# Patient Record
Sex: Male | Born: 1950 | Marital: Single
Health system: Southern US, Community
[De-identification: ages and names within clinical notes are randomized; demographics above are authoritative.]

---

## 2005-07-07 ENCOUNTER — Other Ambulatory Visit: Payer: Self-pay

## 2005-07-07 ENCOUNTER — Emergency Department: Payer: Self-pay | Admitting: Emergency Medicine

## 2005-10-01 ENCOUNTER — Emergency Department: Payer: Self-pay | Admitting: Emergency Medicine

## 2005-10-03 ENCOUNTER — Emergency Department: Payer: Self-pay | Admitting: Emergency Medicine

## 2006-02-09 ENCOUNTER — Ambulatory Visit: Payer: Self-pay | Admitting: Nephrology

## 2011-01-10 ENCOUNTER — Inpatient Hospital Stay: Payer: Self-pay | Admitting: Vascular Surgery

## 2011-01-30 ENCOUNTER — Ambulatory Visit: Payer: Self-pay | Admitting: Vascular Surgery

## 2011-02-04 ENCOUNTER — Ambulatory Visit: Payer: Self-pay | Admitting: Vascular Surgery

## 2011-04-17 ENCOUNTER — Ambulatory Visit: Payer: Self-pay | Admitting: Vascular Surgery

## 2011-05-27 ENCOUNTER — Ambulatory Visit: Payer: Self-pay | Admitting: Vascular Surgery

## 2012-02-19 ENCOUNTER — Ambulatory Visit: Payer: Self-pay | Admitting: Internal Medicine

## 2012-02-20 ENCOUNTER — Emergency Department: Payer: Self-pay | Admitting: *Deleted

## 2012-02-20 LAB — COMPREHENSIVE METABOLIC PANEL
Albumin: 3 g/dL — ABNORMAL LOW (ref 3.4–5.0)
Alkaline Phosphatase: 340 U/L — ABNORMAL HIGH (ref 50–136)
Anion Gap: 18 — ABNORMAL HIGH (ref 7–16)
Bilirubin,Total: 1.8 mg/dL — ABNORMAL HIGH (ref 0.2–1.0)
Co2: 24 mmol/L (ref 21–32)
Creatinine: 12.58 mg/dL — ABNORMAL HIGH (ref 0.60–1.30)
Glucose: 84 mg/dL (ref 65–99)
Osmolality: 294 (ref 275–301)
Potassium: 6 mmol/L — ABNORMAL HIGH (ref 3.5–5.1)
Sodium: 138 mmol/L (ref 136–145)

## 2012-02-20 LAB — CBC
MCH: 30.6 pg (ref 26.0–34.0)
MCV: 92 fL (ref 80–100)
Platelet: 163 10*3/uL (ref 150–440)
RDW: 15.4 % — ABNORMAL HIGH (ref 11.5–14.5)
WBC: 13.9 10*3/uL — ABNORMAL HIGH (ref 3.8–10.6)

## 2012-02-20 LAB — LIPASE, BLOOD: Lipase: 197 U/L (ref 73–393)

## 2012-02-23 ENCOUNTER — Inpatient Hospital Stay: Payer: Self-pay | Admitting: Internal Medicine

## 2012-02-23 LAB — COMPREHENSIVE METABOLIC PANEL
Albumin: 2.8 g/dL — ABNORMAL LOW (ref 3.4–5.0)
Anion Gap: 22 — ABNORMAL HIGH (ref 7–16)
BUN: 123 mg/dL — ABNORMAL HIGH (ref 7–18)
Bilirubin,Total: 2 mg/dL — ABNORMAL HIGH (ref 0.2–1.0)
Chloride: 90 mmol/L — ABNORMAL LOW (ref 98–107)
Co2: 21 mmol/L (ref 21–32)
Creatinine: 19.98 mg/dL — ABNORMAL HIGH (ref 0.60–1.30)
EGFR (Non-African Amer.): 3 — ABNORMAL LOW
Glucose: 116 mg/dL — ABNORMAL HIGH (ref 65–99)
Osmolality: 307 (ref 275–301)
Potassium: 6.4 mmol/L — ABNORMAL HIGH (ref 3.5–5.1)
SGOT(AST): 28 U/L (ref 15–37)
SGPT (ALT): 40 U/L
Sodium: 133 mmol/L — ABNORMAL LOW (ref 136–145)
Total Protein: 8.7 g/dL — ABNORMAL HIGH (ref 6.4–8.2)

## 2012-02-23 LAB — CBC
HCT: 36.6 % — ABNORMAL LOW (ref 40.0–52.0)
HGB: 12.1 g/dL — ABNORMAL LOW (ref 13.0–18.0)
MCH: 30.2 pg (ref 26.0–34.0)
MCHC: 33 g/dL (ref 32.0–36.0)
MCV: 92 fL (ref 80–100)
RBC: 3.99 10*6/uL — ABNORMAL LOW (ref 4.40–5.90)

## 2012-02-23 LAB — URINALYSIS, COMPLETE
Glucose,UR: 50 mg/dL (ref 0–75)
Ketone: NEGATIVE
Nitrite: NEGATIVE
Ph: 8 (ref 4.5–8.0)
RBC,UR: 28 /HPF (ref 0–5)

## 2012-02-23 LAB — TROPONIN I
Troponin-I: 0.04 ng/mL
Troponin-I: 0.07 ng/mL — ABNORMAL HIGH

## 2012-02-23 LAB — LIPASE, BLOOD: Lipase: 119 U/L (ref 73–393)

## 2012-02-24 LAB — BASIC METABOLIC PANEL
Anion Gap: 24 — ABNORMAL HIGH (ref 7–16)
Calcium, Total: 6.8 mg/dL — CL (ref 8.5–10.1)
EGFR (African American): 3 — ABNORMAL LOW
EGFR (Non-African Amer.): 2 — ABNORMAL LOW
Osmolality: 307 (ref 275–301)

## 2012-02-24 LAB — RENAL FUNCTION PANEL
Albumin: 2.6 g/dL — ABNORMAL LOW (ref 3.4–5.0)
Anion Gap: 30 — ABNORMAL HIGH (ref 7–16)
Calcium, Total: 7.2 mg/dL — ABNORMAL LOW (ref 8.5–10.1)
Chloride: 91 mmol/L — ABNORMAL LOW (ref 98–107)
Co2: 14 mmol/L — ABNORMAL LOW (ref 21–32)
Creatinine: 21.85 mg/dL — ABNORMAL HIGH (ref 0.60–1.30)
Glucose: 136 mg/dL — ABNORMAL HIGH (ref 65–99)
Osmolality: 315 (ref 275–301)
Phosphorus: 5.8 mg/dL — ABNORMAL HIGH (ref 2.5–4.9)

## 2012-02-24 LAB — CBC WITH DIFFERENTIAL/PLATELET
Eosinophil: 1 %
HCT: 34.2 % — ABNORMAL LOW (ref 40.0–52.0)
Lymphocytes: 3 %
MCH: 30.5 pg (ref 26.0–34.0)
MCHC: 33.9 g/dL (ref 32.0–36.0)
MCV: 90 fL (ref 80–100)
Metamyelocyte: 1 %
Platelet: 163 10*3/uL (ref 150–440)
RDW: 16 % — ABNORMAL HIGH (ref 11.5–14.5)
Segmented Neutrophils: 83 %
WBC: 28.6 10*3/uL — ABNORMAL HIGH (ref 3.8–10.6)

## 2012-02-24 LAB — CK TOTAL AND CKMB (NOT AT ARMC): CK-MB: 2.9 ng/mL (ref 0.5–3.6)

## 2012-02-24 LAB — PSA: PSA: 6.3 ng/mL — ABNORMAL HIGH (ref 0.0–4.0)

## 2012-02-25 LAB — CBC WITH DIFFERENTIAL/PLATELET
Basophil #: 0 10*3/uL (ref 0.0–0.1)
Eosinophil %: 1.1 %
Lymphocyte #: 1.7 10*3/uL (ref 1.0–3.6)
Lymphocyte %: 5.8 %
MCH: 30.2 pg (ref 26.0–34.0)
MCV: 92 fL (ref 80–100)
Monocyte %: 5.2 %
Platelet: 210 10*3/uL (ref 150–440)
RBC: 3.37 10*6/uL — ABNORMAL LOW (ref 4.40–5.90)
RDW: 16.3 % — ABNORMAL HIGH (ref 11.5–14.5)

## 2012-02-25 LAB — BASIC METABOLIC PANEL
Anion Gap: 22 — ABNORMAL HIGH (ref 7–16)
BUN: 101 mg/dL — ABNORMAL HIGH (ref 7–18)
Calcium, Total: 7.1 mg/dL — ABNORMAL LOW (ref 8.5–10.1)
Co2: 23 mmol/L (ref 21–32)
Creatinine: 19.05 mg/dL — ABNORMAL HIGH (ref 0.60–1.30)
Potassium: 5 mmol/L (ref 3.5–5.1)
Sodium: 138 mmol/L (ref 136–145)

## 2012-02-25 LAB — PHOSPHORUS: Phosphorus: 7.1 mg/dL — ABNORMAL HIGH (ref 2.5–4.9)

## 2012-02-26 LAB — CBC WITH DIFFERENTIAL/PLATELET
Basophil %: 0 %
Eosinophil %: 0.6 %
Lymphocyte #: 1.6 10*3/uL (ref 1.0–3.6)
MCHC: 33.4 g/dL (ref 32.0–36.0)
MCV: 91 fL (ref 80–100)
Monocyte %: 5.9 %
Platelet: 218 10*3/uL (ref 150–440)
RBC: 3.41 10*6/uL — ABNORMAL LOW (ref 4.40–5.90)
WBC: 26.8 10*3/uL — ABNORMAL HIGH (ref 3.8–10.6)

## 2012-02-26 LAB — BASIC METABOLIC PANEL
Anion Gap: 18 — ABNORMAL HIGH (ref 7–16)
BUN: 65 mg/dL — ABNORMAL HIGH (ref 7–18)
Chloride: 89 mmol/L — ABNORMAL LOW (ref 98–107)
Co2: 27 mmol/L (ref 21–32)
Creatinine: 13.57 mg/dL — ABNORMAL HIGH (ref 0.60–1.30)
EGFR (Non-African Amer.): 4 — ABNORMAL LOW
Osmolality: 288 (ref 275–301)
Potassium: 5.9 mmol/L — ABNORMAL HIGH (ref 3.5–5.1)

## 2012-02-26 LAB — CULTURE, BLOOD (SINGLE)

## 2012-02-26 LAB — PHOSPHORUS: Phosphorus: 8 mg/dL — ABNORMAL HIGH (ref 2.5–4.9)

## 2012-02-27 LAB — CBC WITH DIFFERENTIAL/PLATELET
Bands: 7 %
Comment - H1-Com2: NORMAL
HCT: 30.2 % — ABNORMAL LOW (ref 40.0–52.0)
MCV: 92 fL (ref 80–100)
Monocytes: 5 %
RBC: 3.29 10*6/uL — ABNORMAL LOW (ref 4.40–5.90)
Segmented Neutrophils: 84 %
WBC: 26 10*3/uL — ABNORMAL HIGH (ref 3.8–10.6)

## 2012-02-27 LAB — BASIC METABOLIC PANEL
Anion Gap: 16 (ref 7–16)
Calcium, Total: 8.5 mg/dL (ref 8.5–10.1)
Chloride: 90 mmol/L — ABNORMAL LOW (ref 98–107)
Co2: 27 mmol/L (ref 21–32)
Creatinine: 11.04 mg/dL — ABNORMAL HIGH (ref 0.60–1.30)
Glucose: 111 mg/dL — ABNORMAL HIGH (ref 65–99)
Potassium: 4.9 mmol/L (ref 3.5–5.1)
Sodium: 133 mmol/L — ABNORMAL LOW (ref 136–145)

## 2012-02-28 LAB — BASIC METABOLIC PANEL
Calcium, Total: 8.5 mg/dL (ref 8.5–10.1)
Chloride: 88 mmol/L — ABNORMAL LOW (ref 98–107)
Co2: 25 mmol/L (ref 21–32)
EGFR (Non-African Amer.): 4 — ABNORMAL LOW
Glucose: 102 mg/dL — ABNORMAL HIGH (ref 65–99)
Osmolality: 282 (ref 275–301)
Sodium: 130 mmol/L — ABNORMAL LOW (ref 136–145)

## 2012-02-28 LAB — CBC WITH DIFFERENTIAL/PLATELET
Bands: 8 %
Comment - H1-Com2: NORMAL
Lymphocytes: 2 %
MCHC: 33.4 g/dL (ref 32.0–36.0)
MCV: 91 fL (ref 80–100)
Monocytes: 3 %
Platelet: 234 10*3/uL (ref 150–440)
RBC: 2.88 10*6/uL — ABNORMAL LOW (ref 4.40–5.90)
RDW: 16.2 % — ABNORMAL HIGH (ref 11.5–14.5)
Segmented Neutrophils: 86 %
WBC: 24.5 10*3/uL — ABNORMAL HIGH (ref 3.8–10.6)

## 2012-02-29 LAB — BASIC METABOLIC PANEL
Anion Gap: 19 — ABNORMAL HIGH (ref 7–16)
BUN: 94 mg/dL — ABNORMAL HIGH (ref 7–18)
Co2: 24 mmol/L (ref 21–32)
Creatinine: 13.91 mg/dL — ABNORMAL HIGH (ref 0.60–1.30)
Glucose: 105 mg/dL — ABNORMAL HIGH (ref 65–99)
Osmolality: 288 (ref 275–301)
Potassium: 6.7 mmol/L (ref 3.5–5.1)
Sodium: 129 mmol/L — ABNORMAL LOW (ref 136–145)

## 2012-02-29 LAB — CBC WITH DIFFERENTIAL/PLATELET
Bands: 5 %
Comment - H1-Com2: NORMAL
Lymphocytes: 5 %
MCHC: 33.4 g/dL (ref 32.0–36.0)
Platelet: 224 10*3/uL (ref 150–440)
RDW: 16 % — ABNORMAL HIGH (ref 11.5–14.5)
Segmented Neutrophils: 86 %
WBC: 22.9 10*3/uL — ABNORMAL HIGH (ref 3.8–10.6)

## 2012-02-29 LAB — VANCOMYCIN, TROUGH: Vancomycin, Trough: 7 ug/mL — ABNORMAL LOW (ref 10–20)

## 2012-02-29 LAB — POTASSIUM: Potassium: 3.1 mmol/L — ABNORMAL LOW (ref 3.5–5.1)

## 2012-03-01 LAB — BASIC METABOLIC PANEL
BUN: 81 mg/dL — ABNORMAL HIGH (ref 7–18)
Calcium, Total: 7.8 mg/dL — ABNORMAL LOW (ref 8.5–10.1)
Chloride: 90 mmol/L — ABNORMAL LOW (ref 98–107)
Co2: 27 mmol/L (ref 21–32)
Osmolality: 299 (ref 275–301)
Potassium: 4.3 mmol/L (ref 3.5–5.1)

## 2012-03-01 LAB — CBC WITH DIFFERENTIAL/PLATELET
Bands: 2 %
MCH: 30.2 pg (ref 26.0–34.0)
MCHC: 33.3 g/dL (ref 32.0–36.0)
MCV: 91 fL (ref 80–100)
Monocytes: 3 %
Platelet: 269 10*3/uL (ref 150–440)
RBC: 2.69 10*6/uL — ABNORMAL LOW (ref 4.40–5.90)
RDW: 16.3 % — ABNORMAL HIGH (ref 11.5–14.5)
WBC: 14.5 10*3/uL — ABNORMAL HIGH (ref 3.8–10.6)

## 2012-03-01 LAB — PHOSPHORUS: Phosphorus: 8.8 mg/dL — ABNORMAL HIGH (ref 2.5–4.9)

## 2012-03-02 LAB — CULTURE, BLOOD (SINGLE)

## 2012-03-02 LAB — BASIC METABOLIC PANEL
BUN: 93 mg/dL — ABNORMAL HIGH (ref 7–18)
Calcium, Total: 8.1 mg/dL — ABNORMAL LOW (ref 8.5–10.1)
Chloride: 91 mmol/L — ABNORMAL LOW (ref 98–107)
Co2: 25 mmol/L (ref 21–32)
Glucose: 147 mg/dL — ABNORMAL HIGH (ref 65–99)
Osmolality: 300 (ref 275–301)
Potassium: 4.4 mmol/L (ref 3.5–5.1)
Sodium: 134 mmol/L — ABNORMAL LOW (ref 136–145)

## 2012-03-02 LAB — CBC WITH DIFFERENTIAL/PLATELET
Basophil #: 0 10*3/uL (ref 0.0–0.1)
Eosinophil #: 0 10*3/uL (ref 0.0–0.7)
HCT: 26.8 % — ABNORMAL LOW (ref 40.0–52.0)
HGB: 8.9 g/dL — ABNORMAL LOW (ref 13.0–18.0)
Lymphocyte #: 0.8 10*3/uL — ABNORMAL LOW (ref 1.0–3.6)
Lymphocyte %: 5.8 %
MCHC: 33.3 g/dL (ref 32.0–36.0)
MCV: 91 fL (ref 80–100)
Monocyte %: 4.3 %
Neutrophil #: 11.9 10*3/uL — ABNORMAL HIGH (ref 1.4–6.5)
Platelet: 307 10*3/uL (ref 150–440)
RBC: 2.96 10*6/uL — ABNORMAL LOW (ref 4.40–5.90)
RDW: 14.7 % — ABNORMAL HIGH (ref 11.5–14.5)

## 2012-03-03 LAB — CBC WITH DIFFERENTIAL/PLATELET
Basophil #: 0 10*3/uL (ref 0.0–0.1)
Basophil %: 0 %
Eosinophil #: 0 10*3/uL (ref 0.0–0.7)
Eosinophil %: 0 %
HCT: 26.3 % — ABNORMAL LOW (ref 40.0–52.0)
Lymphocyte #: 0.5 10*3/uL — ABNORMAL LOW (ref 1.0–3.6)
Lymphocyte %: 4.1 %
MCHC: 33.2 g/dL (ref 32.0–36.0)
MCV: 92 fL (ref 80–100)
Monocyte %: 2.9 %
Neutrophil #: 11.6 10*3/uL — ABNORMAL HIGH (ref 1.4–6.5)
Platelet: 344 10*3/uL (ref 150–440)
RDW: 15.8 % — ABNORMAL HIGH (ref 11.5–14.5)
WBC: 12.5 10*3/uL — ABNORMAL HIGH (ref 3.8–10.6)

## 2012-03-03 LAB — BASIC METABOLIC PANEL
Anion Gap: 20 — ABNORMAL HIGH (ref 7–16)
BUN: 146 mg/dL — ABNORMAL HIGH (ref 7–18)
Calcium, Total: 7.4 mg/dL — ABNORMAL LOW (ref 8.5–10.1)
Chloride: 89 mmol/L — ABNORMAL LOW (ref 98–107)
Co2: 24 mmol/L (ref 21–32)
Creatinine: 11.14 mg/dL — ABNORMAL HIGH (ref 0.60–1.30)
EGFR (African American): 6 — ABNORMAL LOW
EGFR (Non-African Amer.): 5 — ABNORMAL LOW
Glucose: 158 mg/dL — ABNORMAL HIGH (ref 65–99)
Osmolality: 317 (ref 275–301)
Potassium: 4.7 mmol/L (ref 3.5–5.1)
Sodium: 133 mmol/L — ABNORMAL LOW (ref 136–145)

## 2012-03-04 LAB — CBC WITH DIFFERENTIAL/PLATELET
Basophil #: 0 10*3/uL (ref 0.0–0.1)
Eosinophil %: 0.2 %
HCT: 31 % — ABNORMAL LOW (ref 40.0–52.0)
HGB: 10.2 g/dL — ABNORMAL LOW (ref 13.0–18.0)
MCH: 29.9 pg (ref 26.0–34.0)
MCV: 90 fL (ref 80–100)
Monocyte #: 1 10*3/uL — ABNORMAL HIGH (ref 0.0–0.7)
Monocyte %: 7 %
Neutrophil %: 88.2 %
Platelet: 569 10*3/uL — ABNORMAL HIGH (ref 150–440)
RDW: 14.7 % — ABNORMAL HIGH (ref 11.5–14.5)
WBC: 15 10*3/uL — ABNORMAL HIGH (ref 3.8–10.6)

## 2012-03-04 LAB — BASIC METABOLIC PANEL
BUN: 71 mg/dL — ABNORMAL HIGH (ref 7–18)
Creatinine: 6.65 mg/dL — ABNORMAL HIGH (ref 0.60–1.30)
EGFR (African American): 11 — ABNORMAL LOW
EGFR (Non-African Amer.): 9 — ABNORMAL LOW
Glucose: 93 mg/dL (ref 65–99)
Potassium: 3.1 mmol/L — ABNORMAL LOW (ref 3.5–5.1)
Sodium: 137 mmol/L (ref 136–145)

## 2012-03-04 LAB — CULTURE, BLOOD (SINGLE)

## 2012-03-05 LAB — CBC WITH DIFFERENTIAL/PLATELET
Basophil %: 0 %
Eosinophil #: 0.1 10*3/uL (ref 0.0–0.7)
Eosinophil %: 1 %
HGB: 9.5 g/dL — ABNORMAL LOW (ref 13.0–18.0)
Lymphocyte %: 6.1 %
Neutrophil %: 85.2 %
RBC: 3.14 10*6/uL — ABNORMAL LOW (ref 4.40–5.90)

## 2012-03-06 LAB — CBC WITH DIFFERENTIAL/PLATELET
Basophil %: 0.1 %
Eosinophil #: 0.2 10*3/uL (ref 0.0–0.7)
Eosinophil %: 1.5 %
HGB: 8.8 g/dL — ABNORMAL LOW (ref 13.0–18.0)
Lymphocyte #: 0.6 10*3/uL — ABNORMAL LOW (ref 1.0–3.6)
Lymphocyte %: 5 %
MCH: 30.6 pg (ref 26.0–34.0)
MCHC: 32.8 g/dL (ref 32.0–36.0)
Monocyte #: 0.9 10*3/uL — ABNORMAL HIGH (ref 0.0–0.7)
Monocyte %: 7.1 %
Neutrophil #: 11 10*3/uL — ABNORMAL HIGH (ref 1.4–6.5)
Neutrophil %: 86.3 %
Platelet: 531 10*3/uL — ABNORMAL HIGH (ref 150–440)
RBC: 2.88 10*6/uL — ABNORMAL LOW (ref 4.40–5.90)
WBC: 12.7 10*3/uL — ABNORMAL HIGH (ref 3.8–10.6)

## 2012-03-07 LAB — CBC WITH DIFFERENTIAL/PLATELET
Eosinophil %: 1.4 %
HCT: 25.6 % — ABNORMAL LOW (ref 40.0–52.0)
HGB: 8.5 g/dL — ABNORMAL LOW (ref 13.0–18.0)
Lymphocyte #: 0.6 10*3/uL — ABNORMAL LOW (ref 1.0–3.6)
Lymphocyte %: 4.6 %
MCH: 31.1 pg (ref 26.0–34.0)
MCV: 94 fL (ref 80–100)
Monocyte %: 4.5 %
Neutrophil #: 12.6 10*3/uL — ABNORMAL HIGH (ref 1.4–6.5)
RBC: 2.73 10*6/uL — ABNORMAL LOW (ref 4.40–5.90)
RDW: 16.3 % — ABNORMAL HIGH (ref 11.5–14.5)
WBC: 14.1 10*3/uL — ABNORMAL HIGH (ref 3.8–10.6)

## 2012-03-07 LAB — CULTURE, BLOOD (SINGLE)

## 2012-03-08 LAB — CBC WITH DIFFERENTIAL/PLATELET
Eosinophil #: 0.3 10*3/uL (ref 0.0–0.7)
Eosinophil %: 2.4 %
HCT: 22.6 % — ABNORMAL LOW (ref 40.0–52.0)
Lymphocyte #: 0.8 10*3/uL — ABNORMAL LOW (ref 1.0–3.6)
Lymphocyte %: 6.3 %
MCH: 31.5 pg (ref 26.0–34.0)
MCHC: 33.6 g/dL (ref 32.0–36.0)
MCV: 94 fL (ref 80–100)
Monocyte #: 0.9 10*3/uL — ABNORMAL HIGH (ref 0.0–0.7)
Neutrophil %: 83.6 %
Platelet: 551 10*3/uL — ABNORMAL HIGH (ref 150–440)
RBC: 2.42 10*6/uL — ABNORMAL LOW (ref 4.40–5.90)

## 2012-03-08 LAB — VANCOMYCIN, TROUGH: Vancomycin, Trough: 21 ug/mL (ref 10–20)

## 2012-03-08 LAB — PHOSPHORUS: Phosphorus: 8.6 mg/dL — ABNORMAL HIGH (ref 2.5–4.9)

## 2012-03-09 LAB — CBC WITH DIFFERENTIAL/PLATELET
Basophil #: 0 10*3/uL (ref 0.0–0.1)
Eosinophil %: 1.5 %
MCH: 31.4 pg (ref 26.0–34.0)
Monocyte #: 1 10*3/uL — ABNORMAL HIGH (ref 0.0–0.7)
Monocyte %: 7.9 %
Neutrophil %: 84.5 %
Platelet: 551 10*3/uL — ABNORMAL HIGH (ref 150–440)
RBC: 2.51 10*6/uL — ABNORMAL LOW (ref 4.40–5.90)
RDW: 16 % — ABNORMAL HIGH (ref 11.5–14.5)
WBC: 13.1 10*3/uL — ABNORMAL HIGH (ref 3.8–10.6)

## 2012-03-09 LAB — BASIC METABOLIC PANEL
BUN: 56 mg/dL — ABNORMAL HIGH (ref 7–18)
Calcium, Total: 7.8 mg/dL — ABNORMAL LOW (ref 8.5–10.1)
Chloride: 96 mmol/L — ABNORMAL LOW (ref 98–107)
Co2: 28 mmol/L (ref 21–32)
EGFR (Non-African Amer.): 8 — ABNORMAL LOW
Osmolality: 291 (ref 275–301)
Potassium: 3.6 mmol/L (ref 3.5–5.1)
Sodium: 138 mmol/L (ref 136–145)

## 2012-03-09 LAB — CULTURE, BLOOD (SINGLE)

## 2012-03-09 LAB — MAGNESIUM: Magnesium: 1.9 mg/dL

## 2012-03-10 LAB — PHOSPHORUS: Phosphorus: 7.2 mg/dL — ABNORMAL HIGH (ref 2.5–4.9)

## 2012-03-11 LAB — CBC WITH DIFFERENTIAL/PLATELET
Basophil %: 0.8 %
Eosinophil #: 0.1 10*3/uL (ref 0.0–0.7)
Eosinophil %: 1 %
Lymphocyte %: 6.4 %
MCH: 31.8 pg (ref 26.0–34.0)
MCHC: 33.3 g/dL (ref 32.0–36.0)
Monocyte #: 1.4 10*3/uL — ABNORMAL HIGH (ref 0.0–0.7)
Neutrophil #: 9.7 10*3/uL — ABNORMAL HIGH (ref 1.4–6.5)
RBC: 2.28 10*6/uL — ABNORMAL LOW (ref 4.40–5.90)

## 2012-03-12 LAB — CBC WITH DIFFERENTIAL/PLATELET
Basophil #: 0 10*3/uL (ref 0.0–0.1)
Basophil %: 0 %
Eosinophil %: 0 %
HCT: 18.5 % — ABNORMAL LOW (ref 40.0–52.0)
HGB: 6.2 g/dL — ABNORMAL LOW (ref 13.0–18.0)
Lymphocyte #: 0.4 10*3/uL — ABNORMAL LOW (ref 1.0–3.6)
Lymphocyte %: 4.6 %
MCV: 96 fL (ref 80–100)
Monocyte %: 0.8 %
Neutrophil #: 9 10*3/uL — ABNORMAL HIGH (ref 1.4–6.5)
Neutrophil %: 94.6 %
RBC: 1.93 10*6/uL — ABNORMAL LOW (ref 4.40–5.90)
RDW: 17.5 % — ABNORMAL HIGH (ref 11.5–14.5)

## 2012-03-12 LAB — BASIC METABOLIC PANEL
Anion Gap: 16 (ref 7–16)
Calcium, Total: 8.1 mg/dL — ABNORMAL LOW (ref 8.5–10.1)
Chloride: 98 mmol/L (ref 98–107)
Creatinine: 9.66 mg/dL — ABNORMAL HIGH (ref 0.60–1.30)
EGFR (African American): 7 — ABNORMAL LOW
EGFR (Non-African Amer.): 6 — ABNORMAL LOW
Glucose: 146 mg/dL — ABNORMAL HIGH (ref 65–99)
Osmolality: 306 (ref 275–301)
Potassium: 5.7 mmol/L — ABNORMAL HIGH (ref 3.5–5.1)

## 2012-03-12 LAB — PHOSPHORUS: Phosphorus: 8.9 mg/dL — ABNORMAL HIGH (ref 2.5–4.9)

## 2012-03-13 LAB — HEMOGLOBIN: HGB: 9.7 g/dL — ABNORMAL LOW (ref 13.0–18.0)

## 2012-03-15 LAB — CBC WITH DIFFERENTIAL/PLATELET
Basophil #: 0 10*3/uL (ref 0.0–0.1)
Eosinophil %: 0 %
HCT: 29.5 % — ABNORMAL LOW (ref 40.0–52.0)
HGB: 9.7 g/dL — ABNORMAL LOW (ref 13.0–18.0)
Lymphocyte #: 0.4 10*3/uL — ABNORMAL LOW (ref 1.0–3.6)
Lymphocyte %: 5.1 %
MCHC: 33 g/dL (ref 32.0–36.0)
MCV: 92 fL (ref 80–100)
Monocyte #: 0.4 10*3/uL (ref 0.0–0.7)
Monocyte %: 5 %
Neutrophil #: 7.3 10*3/uL — ABNORMAL HIGH (ref 1.4–6.5)
Neutrophil %: 89.9 %
RBC: 3.21 10*6/uL — ABNORMAL LOW (ref 4.40–5.90)
RDW: 16.3 % — ABNORMAL HIGH (ref 11.5–14.5)
WBC: 8.1 10*3/uL (ref 3.8–10.6)

## 2012-03-15 LAB — RENAL FUNCTION PANEL
Anion Gap: 17 — ABNORMAL HIGH (ref 7–16)
Calcium, Total: 7.6 mg/dL — ABNORMAL LOW (ref 8.5–10.1)
Creatinine: 9.48 mg/dL — ABNORMAL HIGH (ref 0.60–1.30)
EGFR (African American): 7 — ABNORMAL LOW
EGFR (Non-African Amer.): 6 — ABNORMAL LOW
Glucose: 103 mg/dL — ABNORMAL HIGH (ref 65–99)
Osmolality: 319 (ref 275–301)
Phosphorus: 8.9 mg/dL — ABNORMAL HIGH (ref 2.5–4.9)
Sodium: 138 mmol/L (ref 136–145)

## 2012-03-15 LAB — PROT IMMUNOELECTROPHORES(ARMC)

## 2012-03-15 LAB — CULTURE, BLOOD (SINGLE)

## 2012-03-16 LAB — CBC WITH DIFFERENTIAL/PLATELET
Eosinophil #: 0 10*3/uL (ref 0.0–0.7)
HCT: 30.2 % — ABNORMAL LOW (ref 40.0–52.0)
Lymphocyte #: 0.5 10*3/uL — ABNORMAL LOW (ref 1.0–3.6)
MCH: 30.3 pg (ref 26.0–34.0)
MCV: 93 fL (ref 80–100)
Monocyte %: 4.2 %
Neutrophil %: 90.1 %
Platelet: 187 10*3/uL (ref 150–440)
RBC: 3.26 10*6/uL — ABNORMAL LOW (ref 4.40–5.90)
RDW: 16.6 % — ABNORMAL HIGH (ref 11.5–14.5)

## 2012-03-17 LAB — RENAL FUNCTION PANEL
Anion Gap: 15 (ref 7–16)
Calcium, Total: 7.1 mg/dL — ABNORMAL LOW (ref 8.5–10.1)
Chloride: 95 mmol/L — ABNORMAL LOW (ref 98–107)
Co2: 25 mmol/L (ref 21–32)
EGFR (African American): 9 — ABNORMAL LOW
Osmolality: 305 (ref 275–301)
Phosphorus: 8.6 mg/dL — ABNORMAL HIGH (ref 2.5–4.9)

## 2012-03-19 LAB — CBC WITH DIFFERENTIAL/PLATELET
Basophil #: 0 10*3/uL (ref 0.0–0.1)
Basophil #: 0 10*3/uL (ref 0.0–0.1)
Basophil %: 0 %
Basophil %: 0 %
Eosinophil #: 0 10*3/uL (ref 0.0–0.7)
Eosinophil #: 0 10*3/uL (ref 0.0–0.7)
Eosinophil %: 0 %
Eosinophil %: 0 %
HCT: 29.8 % — ABNORMAL LOW (ref 40.0–52.0)
HCT: 30.2 % — ABNORMAL LOW (ref 40.0–52.0)
HGB: 9.8 g/dL — ABNORMAL LOW (ref 13.0–18.0)
HGB: 10 g/dL — ABNORMAL LOW (ref 13.0–18.0)
Lymphocyte #: 0.3 10*3/uL — ABNORMAL LOW (ref 1.0–3.6)
Lymphocyte %: 2.7 %
Lymphocyte %: 1.7 %
MCH: 30.9 pg (ref 26.0–34.0)
MCH: 31.2 pg (ref 26.0–34.0)
MCHC: 33.3 g/dL (ref 32.0–36.0)
MCV: 94 fL (ref 80–100)
Monocyte #: 0.4 10*3/uL (ref 0.0–0.7)
Monocyte #: 0.2 10*3/uL (ref 0.0–0.7)
Monocyte %: 3.4 %
Monocyte %: 1.8 %
Neutrophil #: 11.6 10*3/uL — ABNORMAL HIGH (ref 1.4–6.5)
Neutrophil #: 12.3 10*3/uL — ABNORMAL HIGH (ref 1.4–6.5)
Neutrophil %: 93.9 %
Neutrophil %: 96.5 %
Platelet: 194 10*3/uL (ref 150–440)
RBC: 3.16 10*6/uL — ABNORMAL LOW (ref 4.40–5.90)
RDW: 16.9 % — ABNORMAL HIGH (ref 11.5–14.5)
RDW: 16.2 % — ABNORMAL HIGH (ref 11.5–14.5)
WBC: 12.3 10*3/uL — ABNORMAL HIGH (ref 3.8–10.6)

## 2012-03-19 LAB — RENAL FUNCTION PANEL
Anion Gap: 16 (ref 7–16)
Calcium, Total: 6.5 mg/dL — CL (ref 8.5–10.1)
Chloride: 97 mmol/L — ABNORMAL LOW (ref 98–107)
Co2: 24 mmol/L (ref 21–32)
EGFR (Non-African Amer.): 8 — ABNORMAL LOW
Glucose: 174 mg/dL — ABNORMAL HIGH (ref 65–99)
Osmolality: 309 (ref 275–301)
Potassium: 5.1 mmol/L (ref 3.5–5.1)

## 2012-03-19 LAB — CULTURE, BLOOD (SINGLE)

## 2012-03-21 ENCOUNTER — Ambulatory Visit: Payer: Self-pay | Admitting: Internal Medicine

## 2012-03-21 LAB — PSA: PSA: 1.2 ng/mL (ref 0.0–4.0)

## 2012-03-22 LAB — CBC WITH DIFFERENTIAL/PLATELET
Basophil %: 0 %
Eosinophil %: 0.1 %
Eosinophil: 1 %
Lymphocyte #: 0.6 10*3/uL — ABNORMAL LOW (ref 1.0–3.6)
Lymphocyte %: 3.5 %
MCH: 31.6 pg (ref 26.0–34.0)
MCV: 95 fL (ref 80–100)
Monocyte #: 0.1 10*3/uL (ref 0.0–0.7)
Monocytes: 2 %
Neutrophil #: 15.8 10*3/uL — ABNORMAL HIGH (ref 1.4–6.5)
Platelet: 253 10*3/uL (ref 150–440)
RBC: 3.5 10*6/uL — ABNORMAL LOW (ref 4.40–5.90)
RDW: 18.2 % — ABNORMAL HIGH (ref 11.5–14.5)
WBC: 16.5 10*3/uL — ABNORMAL HIGH (ref 3.8–10.6)

## 2012-03-22 LAB — RENAL FUNCTION PANEL
BUN: 110 mg/dL — ABNORMAL HIGH (ref 7–18)
Chloride: 97 mmol/L — ABNORMAL LOW (ref 98–107)
Co2: 24 mmol/L (ref 21–32)
Creatinine: 7.78 mg/dL — ABNORMAL HIGH (ref 0.60–1.30)
Osmolality: 308 (ref 275–301)
Phosphorus: 6.4 mg/dL — ABNORMAL HIGH (ref 2.5–4.9)
Potassium: 5.9 mmol/L — ABNORMAL HIGH (ref 3.5–5.1)
Sodium: 137 mmol/L (ref 136–145)

## 2012-04-20 ENCOUNTER — Ambulatory Visit: Payer: Self-pay | Admitting: Internal Medicine

## 2012-04-23 ENCOUNTER — Inpatient Hospital Stay: Payer: Self-pay | Admitting: Internal Medicine

## 2012-04-23 LAB — CBC
HCT: 19.5 % — ABNORMAL LOW (ref 40.0–52.0)
HGB: 6.2 g/dL — ABNORMAL LOW (ref 13.0–18.0)
MCV: 91 fL (ref 80–100)
Platelet: 460 10*3/uL — ABNORMAL HIGH (ref 150–440)
RBC: 2.14 10*6/uL — ABNORMAL LOW (ref 4.40–5.90)
WBC: 13.1 10*3/uL — ABNORMAL HIGH (ref 3.8–10.6)

## 2012-04-23 LAB — COMPREHENSIVE METABOLIC PANEL
Alkaline Phosphatase: 159 U/L — ABNORMAL HIGH (ref 50–136)
Chloride: 99 mmol/L (ref 98–107)
Co2: 32 mmol/L (ref 21–32)
SGPT (ALT): 13 U/L
Sodium: 137 mmol/L (ref 136–145)
Total Protein: 7.2 g/dL (ref 6.4–8.2)

## 2012-04-24 DIAGNOSIS — I059 Rheumatic mitral valve disease, unspecified: Secondary | ICD-10-CM

## 2012-04-24 LAB — CBC WITH DIFFERENTIAL/PLATELET
Basophil #: 0.1 10*3/uL (ref 0.0–0.1)
Basophil %: 1.2 %
Basophil %: 2.5 %
Eosinophil #: 0.5 10*3/uL (ref 0.0–0.7)
Eosinophil #: 0.8 10*3/uL — ABNORMAL HIGH (ref 0.0–0.7)
Eosinophil %: 6.9 %
HCT: 17.6 % — ABNORMAL LOW (ref 40.0–52.0)
HCT: 23.7 % — ABNORMAL LOW (ref 40.0–52.0)
HGB: 7.6 g/dL — ABNORMAL LOW (ref 13.0–18.0)
Lymphocyte #: 1.2 10*3/uL (ref 1.0–3.6)
Lymphocyte #: 1.7 10*3/uL (ref 1.0–3.6)
MCH: 29 pg (ref 26.0–34.0)
MCH: 28 pg (ref 26.0–34.0)
MCHC: 31.9 g/dL — ABNORMAL LOW (ref 32.0–36.0)
MCHC: 32.2 g/dL (ref 32.0–36.0)
MCV: 91 fL (ref 80–100)
MCV: 87 fL (ref 80–100)
Monocyte #: 0.7 x10 3/mm (ref 0.2–1.0)
Monocyte %: 6.6 %
Monocyte %: 8.9 %
Neutrophil #: 8.3 10*3/uL — ABNORMAL HIGH (ref 1.4–6.5)
Neutrophil %: 76.6 %
Neutrophil %: 67.9 %
Platelet: 412 10*3/uL (ref 150–440)
RBC: 2.72 10*6/uL — ABNORMAL LOW (ref 4.40–5.90)
RDW: 17.1 % — ABNORMAL HIGH (ref 11.5–14.5)
RDW: 19.1 % — ABNORMAL HIGH (ref 11.5–14.5)
WBC: 11.2 10*3/uL — ABNORMAL HIGH (ref 3.8–10.6)
WBC: 12.3 10*3/uL — ABNORMAL HIGH (ref 3.8–10.6)

## 2012-04-24 LAB — FERRITIN: Ferritin (ARMC): 1420 ng/mL — ABNORMAL HIGH (ref 8–388)

## 2012-04-24 LAB — COMPREHENSIVE METABOLIC PANEL
Albumin: 1.6 g/dL — ABNORMAL LOW (ref 3.4–5.0)
Alkaline Phosphatase: 136 U/L (ref 50–136)
Anion Gap: 9 (ref 7–16)
Bilirubin,Total: 0.7 mg/dL (ref 0.2–1.0)
Calcium, Total: 7.6 mg/dL — ABNORMAL LOW (ref 8.5–10.1)
Creatinine: 2.95 mg/dL — ABNORMAL HIGH (ref 0.60–1.30)
Glucose: 80 mg/dL (ref 65–99)
Osmolality: 275 (ref 275–301)
SGPT (ALT): 10 U/L — ABNORMAL LOW
Total Protein: 6.1 g/dL — ABNORMAL LOW (ref 6.4–8.2)

## 2012-04-24 LAB — LACTATE DEHYDROGENASE: LDH: 301 U/L — ABNORMAL HIGH (ref 87–241)

## 2012-04-25 LAB — BASIC METABOLIC PANEL
Anion Gap: 11 (ref 7–16)
BUN: 17 mg/dL (ref 7–18)
Calcium, Total: 7.8 mg/dL — ABNORMAL LOW (ref 8.5–10.1)
Chloride: 100 mmol/L (ref 98–107)
Co2: 28 mmol/L (ref 21–32)
Creatinine: 4.66 mg/dL — ABNORMAL HIGH (ref 0.60–1.30)
EGFR (African American): 14 — ABNORMAL LOW
EGFR (Non-African Amer.): 12 — ABNORMAL LOW
Osmolality: 278 (ref 275–301)
Potassium: 3.5 mmol/L (ref 3.5–5.1)
Sodium: 139 mmol/L (ref 136–145)

## 2012-04-25 LAB — CBC WITH DIFFERENTIAL/PLATELET
Basophil #: 0.2 10*3/uL — ABNORMAL HIGH (ref 0.0–0.1)
Basophil %: 1.7 %
Eosinophil #: 1.1 10*3/uL — ABNORMAL HIGH (ref 0.0–0.7)
Eosinophil %: 8.3 %
HCT: 27.3 % — ABNORMAL LOW (ref 40.0–52.0)
HGB: 8.7 g/dL — ABNORMAL LOW (ref 13.0–18.0)
Lymphocyte #: 2.1 10*3/uL (ref 1.0–3.6)
Lymphocyte %: 15.5 %
MCH: 27.5 pg (ref 26.0–34.0)
MCHC: 32 g/dL (ref 32.0–36.0)
Monocyte #: 1.1 x10 3/mm — ABNORMAL HIGH (ref 0.2–1.0)
Monocyte %: 8.2 %
Neutrophil #: 9 10*3/uL — ABNORMAL HIGH (ref 1.4–6.5)
WBC: 13.6 10*3/uL — ABNORMAL HIGH (ref 3.8–10.6)

## 2012-04-26 LAB — PHOSPHORUS: Phosphorus: 4.3 mg/dL (ref 2.5–4.9)

## 2012-04-26 LAB — BASIC METABOLIC PANEL
Anion Gap: 9 (ref 7–16)
BUN: 26 mg/dL — ABNORMAL HIGH (ref 7–18)
Calcium, Total: 8.1 mg/dL — ABNORMAL LOW (ref 8.5–10.1)
Creatinine: 6.09 mg/dL — ABNORMAL HIGH (ref 0.60–1.30)
EGFR (African American): 10 — ABNORMAL LOW
Glucose: 87 mg/dL (ref 65–99)
Sodium: 139 mmol/L (ref 136–145)

## 2012-04-26 LAB — CBC WITH DIFFERENTIAL/PLATELET
Eosinophil #: 1.3 10*3/uL — ABNORMAL HIGH (ref 0.0–0.7)
Eosinophil %: 8 %
HCT: 29.7 % — ABNORMAL LOW (ref 40.0–52.0)
HGB: 9.8 g/dL — ABNORMAL LOW (ref 13.0–18.0)
Lymphocyte %: 17.5 %
MCH: 28.6 pg (ref 26.0–34.0)
MCHC: 32.9 g/dL (ref 32.0–36.0)
Monocyte #: 1.2 x10 3/mm — ABNORMAL HIGH (ref 0.2–1.0)
Monocyte %: 6.9 %
Neutrophil #: 11.1 10*3/uL — ABNORMAL HIGH (ref 1.4–6.5)
Neutrophil %: 66.1 %
RBC: 3.41 10*6/uL — ABNORMAL LOW (ref 4.40–5.90)
RDW: 17.9 % — ABNORMAL HIGH (ref 11.5–14.5)
WBC: 16.8 10*3/uL — ABNORMAL HIGH (ref 3.8–10.6)

## 2012-04-27 LAB — CBC WITH DIFFERENTIAL/PLATELET
Basophil #: 0.2 10*3/uL — ABNORMAL HIGH (ref 0.0–0.1)
Basophil %: 1.2 %
Eosinophil #: 1.4 10*3/uL — ABNORMAL HIGH (ref 0.0–0.7)
Eosinophil %: 10.1 %
HCT: 27.4 % — ABNORMAL LOW (ref 40.0–52.0)
HGB: 8.8 g/dL — ABNORMAL LOW (ref 13.0–18.0)
Lymphocyte #: 1.8 10*3/uL (ref 1.0–3.6)
Lymphocyte %: 13.1 %
MCH: 27.6 pg (ref 26.0–34.0)
MCHC: 32 g/dL (ref 32.0–36.0)
MCV: 86 fL (ref 80–100)
Monocyte #: 1.3 x10 3/mm — ABNORMAL HIGH (ref 0.2–1.0)
Monocyte %: 9.3 %
Neutrophil #: 9.2 10*3/uL — ABNORMAL HIGH (ref 1.4–6.5)
Neutrophil %: 66.3 %
Platelet: 278 10*3/uL (ref 150–440)
RBC: 3.18 10*6/uL — ABNORMAL LOW (ref 4.40–5.90)
RDW: 17.7 % — ABNORMAL HIGH (ref 11.5–14.5)
WBC: 13.9 10*3/uL — ABNORMAL HIGH (ref 3.8–10.6)

## 2012-04-27 LAB — PATHOLOGY REPORT

## 2012-04-28 LAB — CBC WITH DIFFERENTIAL/PLATELET
Basophil #: 0.2 10*3/uL — ABNORMAL HIGH (ref 0.0–0.1)
Eosinophil #: 1.9 10*3/uL — ABNORMAL HIGH (ref 0.0–0.7)
Eosinophil %: 12.6 %
HCT: 26.9 % — ABNORMAL LOW (ref 40.0–52.0)
HGB: 8.5 g/dL — ABNORMAL LOW (ref 13.0–18.0)
Lymphocyte #: 1.9 10*3/uL (ref 1.0–3.6)
Lymphocyte %: 12.7 %
Monocyte #: 1.1 x10 3/mm — ABNORMAL HIGH (ref 0.2–1.0)
Neutrophil #: 10.2 10*3/uL — ABNORMAL HIGH (ref 1.4–6.5)
RBC: 3.13 10*6/uL — ABNORMAL LOW (ref 4.40–5.90)
RDW: 17.7 % — ABNORMAL HIGH (ref 11.5–14.5)

## 2012-04-28 LAB — PHOSPHORUS: Phosphorus: 3.6 mg/dL (ref 2.5–4.9)

## 2012-04-29 LAB — PATHOLOGY REPORT

## 2012-05-20 ENCOUNTER — Inpatient Hospital Stay: Payer: Self-pay | Admitting: Internal Medicine

## 2012-05-20 LAB — CBC
HCT: 25 % — ABNORMAL LOW (ref 40.0–52.0)
MCH: 24.8 pg — ABNORMAL LOW (ref 26.0–34.0)
MCHC: 30.1 g/dL — ABNORMAL LOW (ref 32.0–36.0)
WBC: 22.6 10*3/uL — ABNORMAL HIGH (ref 3.8–10.6)

## 2012-05-20 LAB — BASIC METABOLIC PANEL
BUN: 20 mg/dL — ABNORMAL HIGH (ref 7–18)
Co2: 29 mmol/L (ref 21–32)
Creatinine: 4.08 mg/dL — ABNORMAL HIGH (ref 0.60–1.30)
EGFR (African American): 17 — ABNORMAL LOW
EGFR (Non-African Amer.): 15 — ABNORMAL LOW
Glucose: 94 mg/dL (ref 65–99)
Osmolality: 269 (ref 275–301)

## 2012-05-21 ENCOUNTER — Ambulatory Visit: Payer: Self-pay | Admitting: Internal Medicine

## 2012-05-21 LAB — BASIC METABOLIC PANEL
Anion Gap: 11 (ref 7–16)
Co2: 27 mmol/L (ref 21–32)
Creatinine: 4.4 mg/dL — ABNORMAL HIGH (ref 0.60–1.30)
EGFR (Non-African Amer.): 13 — ABNORMAL LOW
Glucose: 89 mg/dL (ref 65–99)
Osmolality: 278 (ref 275–301)
Sodium: 138 mmol/L (ref 136–145)

## 2012-05-21 LAB — CBC WITH DIFFERENTIAL/PLATELET
Basophil %: 1.3 %
Eosinophil #: 0.3 10*3/uL (ref 0.0–0.7)
Eosinophil %: 2 %
HCT: 26 % — ABNORMAL LOW (ref 40.0–52.0)
HGB: 8.2 g/dL — ABNORMAL LOW (ref 13.0–18.0)
Lymphocyte #: 2.1 10*3/uL (ref 1.0–3.6)
MCH: 26 pg (ref 26.0–34.0)
MCV: 82 fL (ref 80–100)
Monocyte #: 1 x10 3/mm (ref 0.2–1.0)
Monocyte %: 5.4 %
Neutrophil #: 14 10*3/uL — ABNORMAL HIGH (ref 1.4–6.5)
RDW: 17.6 % — ABNORMAL HIGH (ref 11.5–14.5)

## 2012-05-22 LAB — CBC WITH DIFFERENTIAL/PLATELET
Basophil #: 0.1 10*3/uL (ref 0.0–0.1)
Basophil %: 0.5 %
Eosinophil #: 0.4 10*3/uL (ref 0.0–0.7)
Eosinophil %: 2.3 %
HCT: 26 % — ABNORMAL LOW (ref 40.0–52.0)
HGB: 8.2 g/dL — ABNORMAL LOW (ref 13.0–18.0)
Lymphocyte #: 1.7 10*3/uL (ref 1.0–3.6)
Lymphocyte %: 9.5 %
MCH: 25.7 pg — ABNORMAL LOW (ref 26.0–34.0)
MCHC: 31.4 g/dL — ABNORMAL LOW (ref 32.0–36.0)
MCV: 82 fL (ref 80–100)
Monocyte #: 0.9 x10 3/mm (ref 0.2–1.0)
Monocyte %: 5.3 %
Neutrophil #: 14.5 10*3/uL — ABNORMAL HIGH (ref 1.4–6.5)
Neutrophil %: 82.4 %
Platelet: 329 10*3/uL (ref 150–440)
RBC: 3.18 10*6/uL — ABNORMAL LOW (ref 4.40–5.90)
RDW: 18.3 % — ABNORMAL HIGH (ref 11.5–14.5)
WBC: 17.6 10*3/uL — ABNORMAL HIGH (ref 3.8–10.6)

## 2012-05-24 LAB — CBC WITH DIFFERENTIAL/PLATELET
Basophil #: 0.2 10*3/uL — ABNORMAL HIGH (ref 0.0–0.1)
Eosinophil #: 0.5 10*3/uL (ref 0.0–0.7)
Eosinophil %: 3.6 %
HCT: 27.7 % — ABNORMAL LOW (ref 40.0–52.0)
Lymphocyte #: 1.6 10*3/uL (ref 1.0–3.6)
Lymphocyte %: 11.5 %
MCH: 27.2 pg (ref 26.0–34.0)
MCV: 84 fL (ref 80–100)
Monocyte %: 6.3 %
Neutrophil #: 10.7 10*3/uL — ABNORMAL HIGH (ref 1.4–6.5)
Neutrophil %: 77 %
Platelet: 341 10*3/uL (ref 150–440)
RBC: 3.28 10*6/uL — ABNORMAL LOW (ref 4.40–5.90)

## 2012-05-24 LAB — CLOSTRIDIUM DIFFICILE BY PCR

## 2012-05-26 LAB — RENAL FUNCTION PANEL
Anion Gap: 7 (ref 7–16)
Calcium, Total: 7.8 mg/dL — ABNORMAL LOW (ref 8.5–10.1)
Co2: 30 mmol/L (ref 21–32)
Creatinine: 4.43 mg/dL — ABNORMAL HIGH (ref 0.60–1.30)
EGFR (African American): 15 — ABNORMAL LOW
EGFR (Non-African Amer.): 13 — ABNORMAL LOW
Glucose: 86 mg/dL (ref 65–99)
Phosphorus: 2.9 mg/dL (ref 2.5–4.9)
Potassium: 3.9 mmol/L (ref 3.5–5.1)
Sodium: 134 mmol/L — ABNORMAL LOW (ref 136–145)

## 2012-05-26 LAB — CBC WITH DIFFERENTIAL/PLATELET
Basophil #: 0.2 10*3/uL — ABNORMAL HIGH (ref 0.0–0.1)
Basophil %: 1.2 %
Eosinophil #: 0.5 10*3/uL (ref 0.0–0.7)
Eosinophil %: 3.6 %
Lymphocyte #: 1.5 10*3/uL (ref 1.0–3.6)
MCHC: 31 g/dL — ABNORMAL LOW (ref 32.0–36.0)
Monocyte #: 1 x10 3/mm (ref 0.2–1.0)
Platelet: 329 10*3/uL (ref 150–440)
RDW: 19 % — ABNORMAL HIGH (ref 11.5–14.5)
WBC: 13 10*3/uL — ABNORMAL HIGH (ref 3.8–10.6)

## 2012-05-26 LAB — CULTURE, BLOOD (SINGLE)

## 2012-05-26 LAB — VANCOMYCIN, TROUGH: Vancomycin, Trough: 16 ug/mL (ref 10–20)

## 2012-05-27 LAB — CBC WITH DIFFERENTIAL/PLATELET
Basophil #: 0.2 x10 3/mm 3 — ABNORMAL HIGH
Basophil %: 1.4 %
Eosinophil #: 0.6 x10 3/mm 3
Eosinophil %: 4.9 %
HCT: 31.7 % — ABNORMAL LOW
HGB: 10.1 g/dL — ABNORMAL LOW
Lymphocyte %: 12 %
Lymphs Abs: 1.4 x10 3/mm 3
MCH: 28.5 pg
MCHC: 31.7 g/dL — ABNORMAL LOW
MCV: 90 fL
Monocyte #: 0.8 "x10 3/mm "
Monocyte %: 7 %
Neutrophil #: 8.7 x10 3/mm 3 — ABNORMAL HIGH
Neutrophil %: 74.7 %
Platelet: 319 x10 3/mm 3
RBC: 3.53 x10 6/mm 3 — ABNORMAL LOW
RDW: 18.8 % — ABNORMAL HIGH
WBC: 11.7 x10 3/mm 3 — ABNORMAL HIGH

## 2012-06-23 ENCOUNTER — Inpatient Hospital Stay: Payer: Self-pay | Admitting: Internal Medicine

## 2012-06-23 LAB — CBC WITH DIFFERENTIAL/PLATELET
Basophil #: 0.1 10*3/uL (ref 0.0–0.1)
Basophil %: 0.7 %
Eosinophil #: 0.8 10*3/uL — ABNORMAL HIGH (ref 0.0–0.7)
Eosinophil #: 0.2 10*3/uL (ref 0.0–0.7)
Eosinophil %: 5.4 %
Lymphocyte %: 39.5 %
Lymphocyte %: 7.7 %
MCH: 28.9 pg (ref 26.0–34.0)
MCHC: 30.7 g/dL — ABNORMAL LOW (ref 32.0–36.0)
MCHC: 30.8 g/dL — ABNORMAL LOW (ref 32.0–36.0)
Monocyte #: 1.3 x10 3/mm — ABNORMAL HIGH (ref 0.2–1.0)
Monocyte %: 6.5 %
Monocyte %: 9.8 %
Neutrophil #: 10.8 10*3/uL — ABNORMAL HIGH (ref 1.4–6.5)
Neutrophil %: 47.6 %
Neutrophil %: 80.1 %
Platelet: 357 10*3/uL (ref 150–440)
Platelet: 290 10*3/uL (ref 150–440)
RDW: 20.8 % — ABNORMAL HIGH (ref 11.5–14.5)
RDW: 20.8 % — ABNORMAL HIGH (ref 11.5–14.5)
WBC: 14.4 10*3/uL — ABNORMAL HIGH (ref 3.8–10.6)
WBC: 13.5 10*3/uL — ABNORMAL HIGH (ref 3.8–10.6)

## 2012-06-23 LAB — COMPREHENSIVE METABOLIC PANEL
Albumin: 2.5 g/dL — ABNORMAL LOW (ref 3.4–5.0)
Alkaline Phosphatase: 737 U/L — ABNORMAL HIGH (ref 50–136)
Bilirubin,Total: 3.7 mg/dL — ABNORMAL HIGH (ref 0.2–1.0)
Calcium, Total: 9.7 mg/dL (ref 8.5–10.1)
Chloride: 102 mmol/L (ref 98–107)
Co2: 27 mmol/L (ref 21–32)
Creatinine: 5.37 mg/dL — ABNORMAL HIGH (ref 0.60–1.30)
EGFR (African American): 12 — ABNORMAL LOW
EGFR (Non-African Amer.): 11 — ABNORMAL LOW
Glucose: 120 mg/dL — ABNORMAL HIGH (ref 65–99)
SGPT (ALT): 22 U/L
Sodium: 136 mmol/L (ref 136–145)

## 2012-06-23 LAB — LIPASE, BLOOD: Lipase: 277 U/L (ref 73–393)

## 2012-06-23 LAB — CK: CK, Total: 67 U/L (ref 35–232)

## 2012-06-24 LAB — BASIC METABOLIC PANEL
Anion Gap: 7 (ref 7–16)
BUN: 24 mg/dL — ABNORMAL HIGH (ref 7–18)
Calcium, Total: 8.8 mg/dL (ref 8.5–10.1)
Chloride: 99 mmol/L (ref 98–107)
Co2: 31 mmol/L (ref 21–32)
EGFR (Non-African Amer.): 18 — ABNORMAL LOW
Glucose: 65 mg/dL (ref 65–99)
Osmolality: 276 (ref 275–301)
Potassium: 4.8 mmol/L (ref 3.5–5.1)

## 2012-06-24 LAB — CBC WITH DIFFERENTIAL/PLATELET
Basophil %: 0.9 %
Eosinophil %: 2.3 %
HCT: 29.5 % — ABNORMAL LOW (ref 40.0–52.0)
Lymphocyte #: 1 10*3/uL (ref 1.0–3.6)
Lymphocyte %: 11.6 %
MCH: 29.5 pg (ref 26.0–34.0)
MCHC: 31.8 g/dL — ABNORMAL LOW (ref 32.0–36.0)
Monocyte #: 0.6 x10 3/mm (ref 0.2–1.0)
Neutrophil #: 6.8 10*3/uL — ABNORMAL HIGH (ref 1.4–6.5)
Platelet: 217 10*3/uL (ref 150–440)
RBC: 3.19 10*6/uL — ABNORMAL LOW (ref 4.40–5.90)
WBC: 8.7 10*3/uL (ref 3.8–10.6)

## 2012-06-24 LAB — TROPONIN I
Troponin-I: 0.06 ng/mL — ABNORMAL HIGH
Troponin-I: 0.04 ng/mL

## 2012-06-24 LAB — MAGNESIUM: Magnesium: 1.9 mg/dL

## 2012-06-25 LAB — RENAL FUNCTION PANEL
Anion Gap: 10 (ref 7–16)
BUN: 38 mg/dL — ABNORMAL HIGH (ref 7–18)
Chloride: 99 mmol/L (ref 98–107)
Co2: 28 mmol/L (ref 21–32)
Creatinine: 4.37 mg/dL — ABNORMAL HIGH (ref 0.60–1.30)
EGFR (African American): 16 — ABNORMAL LOW
EGFR (Non-African Amer.): 13 — ABNORMAL LOW
Glucose: 82 mg/dL (ref 65–99)
Potassium: 5.3 mmol/L — ABNORMAL HIGH (ref 3.5–5.1)
Sodium: 137 mmol/L (ref 136–145)

## 2012-06-25 LAB — CBC WITH DIFFERENTIAL/PLATELET
Basophil #: 0.1 10*3/uL (ref 0.0–0.1)
Eosinophil %: 4.2 %
HCT: 30.6 % — ABNORMAL LOW (ref 40.0–52.0)
HGB: 9.6 g/dL — ABNORMAL LOW (ref 13.0–18.0)
Lymphocyte #: 1.3 10*3/uL (ref 1.0–3.6)
Lymphocyte %: 18.3 %
MCHC: 31.5 g/dL — ABNORMAL LOW (ref 32.0–36.0)
MCV: 92 fL (ref 80–100)
Monocyte %: 10.1 %
Neutrophil #: 4.6 10*3/uL (ref 1.4–6.5)
RBC: 3.34 10*6/uL — ABNORMAL LOW (ref 4.40–5.90)
RDW: 19.9 % — ABNORMAL HIGH (ref 11.5–14.5)
WBC: 7 10*3/uL (ref 3.8–10.6)

## 2012-06-26 LAB — BASIC METABOLIC PANEL
Anion Gap: 4 — ABNORMAL LOW (ref 7–16)
BUN: 22 mg/dL — ABNORMAL HIGH (ref 7–18)
Calcium, Total: 8.2 mg/dL — ABNORMAL LOW (ref 8.5–10.1)
Co2: 33 mmol/L — ABNORMAL HIGH (ref 21–32)
EGFR (African American): 22 — ABNORMAL LOW
EGFR (Non-African Amer.): 19 — ABNORMAL LOW
Glucose: 80 mg/dL (ref 65–99)
Sodium: 138 mmol/L (ref 136–145)

## 2012-06-26 LAB — CBC WITH DIFFERENTIAL/PLATELET
Basophil #: 0.1 10*3/uL (ref 0.0–0.1)
Basophil %: 2 %
Eosinophil %: 4.9 %
HCT: 31.4 % — ABNORMAL LOW (ref 40.0–52.0)
HGB: 9.9 g/dL — ABNORMAL LOW (ref 13.0–18.0)
Lymphocyte #: 1.5 10*3/uL (ref 1.0–3.6)
MCH: 29.2 pg (ref 26.0–34.0)
MCV: 92 fL (ref 80–100)
Monocyte #: 0.6 x10 3/mm (ref 0.2–1.0)
Monocyte %: 9.4 %
Neutrophil #: 3.5 10*3/uL (ref 1.4–6.5)
RBC: 3.4 10*6/uL — ABNORMAL LOW (ref 4.40–5.90)

## 2012-06-27 LAB — PHOSPHORUS: Phosphorus: 3.4 mg/dL (ref 2.5–4.9)

## 2012-09-03 IMAGING — CR DG CHEST 2V
1 series · 2 of 2 positions shown · non-contrast
Comparison: none

REASON FOR EXAM: sob
COMMENTS:

[Series 2: x chest ap · 0.14mm/px · 2 of 2 slices shown]
[im 1/2]
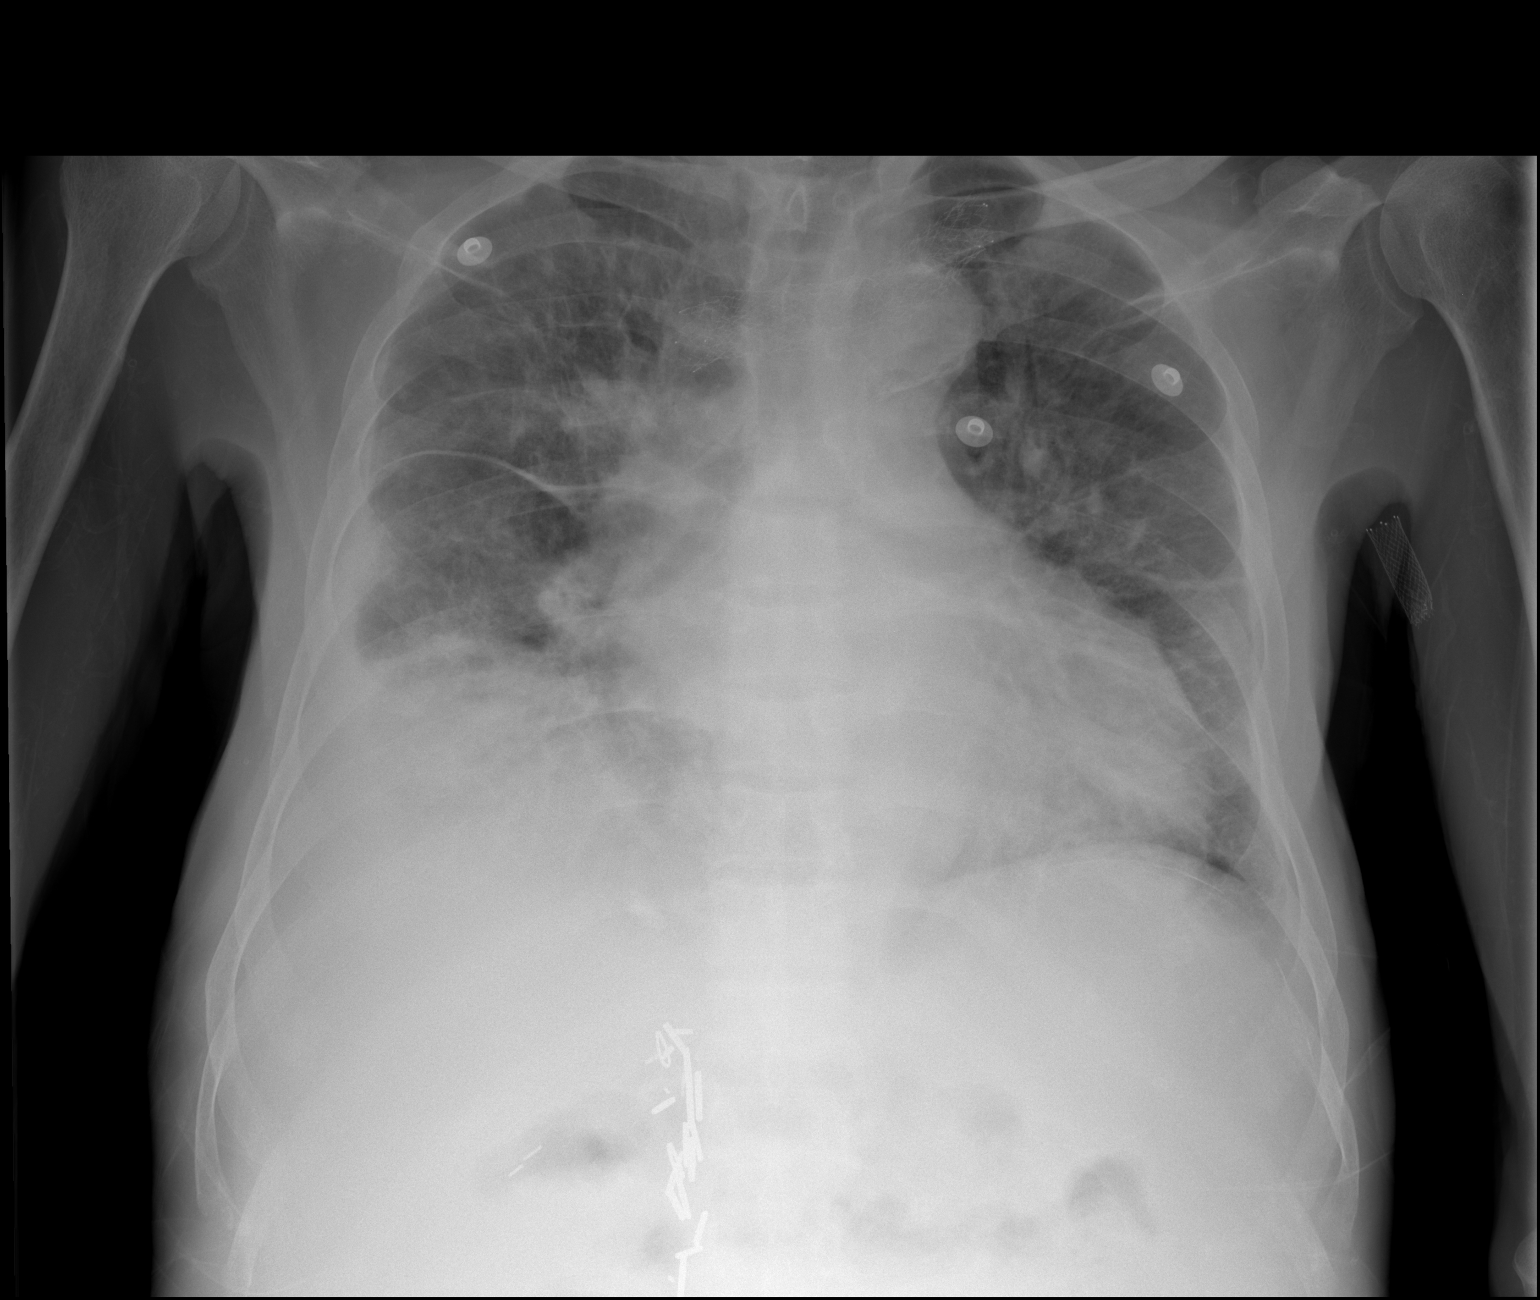
[im 2/2]
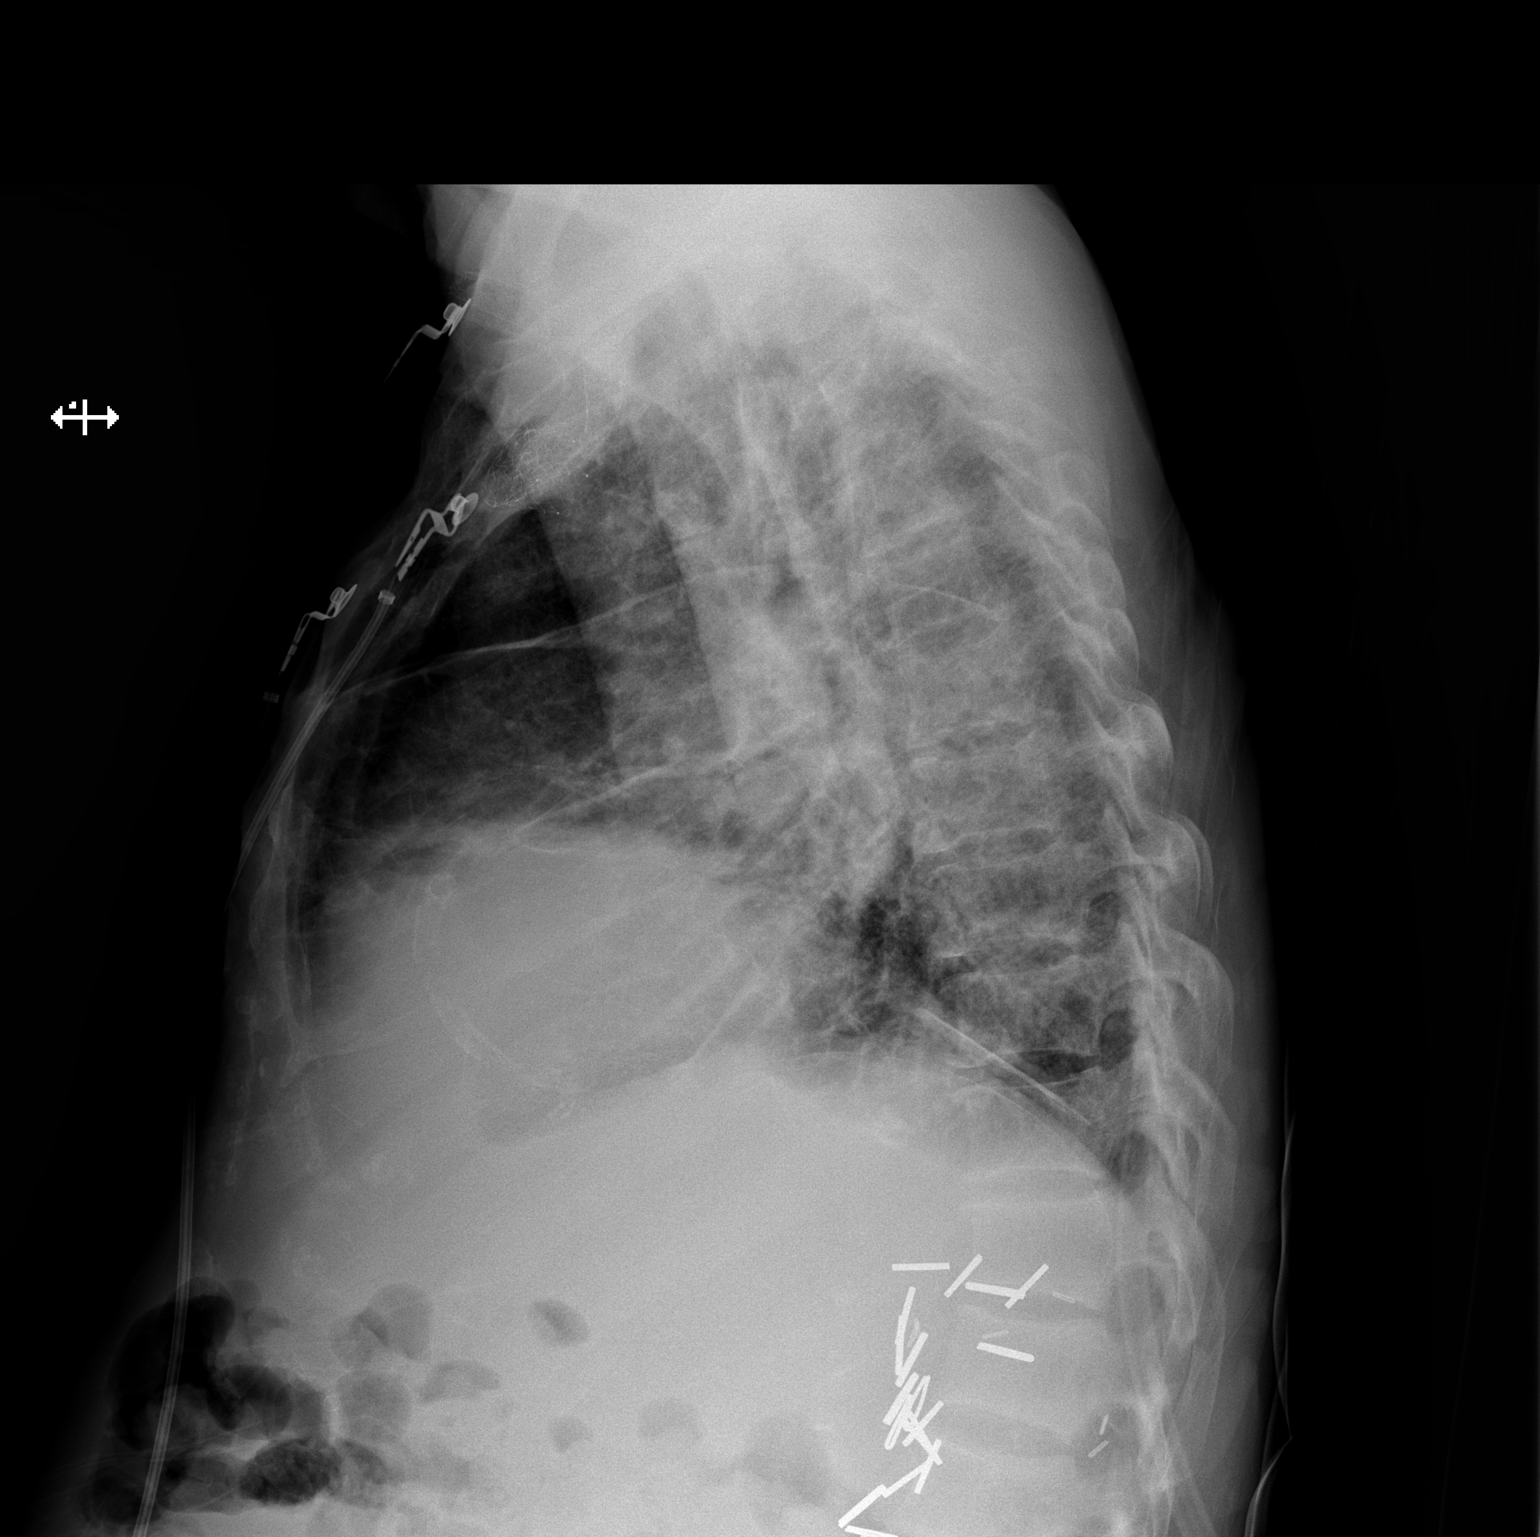

[2 of 2 positions shown; findings below may reference images not displayed]

PROCEDURE:     DXR - DXR CHEST PA (OR AP) AND LATERAL  - June 23, 2012  [DATE]

RESULT:     Comparison made to the study May 20, 2012.

The cardiac silhouette is enlarged. The pulmonary vascularity is engorged
and the interstitial markings are increased. There is density at the right
lung base consistent with atelectasis and pleural fluid.
IMPRESSION: The findings are worrisome for congestive heart failure. I
cannot exclude pneumonia nor atelectasis at the right lung base. Followup
films following therapy would be of value.

## 2015-04-14 NOTE — Consult Note (Signed)
Chief Complaint:   Subjective/Chief Complaint patient continues to report tongue pain and mouth pain, now with lip pain on right as well.  speech evaluated and concern for aspiration due to limited lingual movement   Brief Assessment:   Additional Physical Exam OC/OP- severe xerostomia with crusting of mucosa surfaces of soft and hard palate, dorsal tongue, lips, as well as breakdown of right oral commisure with significant crusting.  tongue edema improved but limited protrusion of tongue   Assessment/Plan:  Assessment/Plan:   Assessment s/p tongue laceration repair with improved edema but continued pain, severe xerostomia    Plan Patient needs lubrication in oral cavity as severe xerostomia with limit healing of tongue.  Rec. aggressive oral hygiene and use of swabs to improve mucosal surfaces.  Tongue laceration healing well.  Rec. follow up as outpatient in 1 month for repeat evaluation.   Electronic Signatures: Sherryll Skoczylas, Rayfield Citizenreighton Charles (MD)  (Signed 19-Mar-13 12:31)  Authored: Chief Complaint, Brief Assessment, Assessment/Plan   Last Updated: 19-Mar-13 12:31 by Flossie DibbleVaught, Leobardo Granlund Charles (MD)

## 2015-04-14 NOTE — Op Note (Signed)
PATIENT NAMLenora [name]:  Orengo, Caroline MR#:  [id number] DATE OF BIRTH:  1951/10/01  DATE OF PROCEDURE:  03/22/2012  PREOPERATIVE DIAGNOSES:  1. MRSA sepsis.  2. Need for antibiotics greater than five days.   POSTOPERATIVE DIAGNOSES: 1. MRSA sepsis.  2. Need for antibiotics greater than five days.   PROCEDURE PERFORMED: Placement of left brachial PICC line.   PROCEDURE PERFORMED BY: Renford DillsGregory G. Kainen Struckman, MD   PROCEDURE: The patient is brought to Special Procedures. Left arm is extended palm upward. Ultrasound is placed in a sterile sleeve. Ultrasound is utilized secondary to lack of appropriate landmarks and to avoid vascular injury. Under ultrasound visualization, the brachial vein is identified. It is echolucent, homogeneous, easily compressible indicating patency. Image is recorded. Under direct ultrasound visualization, micropuncture needle is inserted into the brachial vein. Microwire is then advanced. Peel-away sheath is then advanced. Microwire is advanced under fluoroscopic guidance. The patient has a large innominate and subclavian stent secondary to his dialysis access in the past.   Appropriate measurements based on the wire were made. The catheter is trimmed to 42 cm and is advanced over the wire. This does take some manipulation which accounts for the fluoro time. Ultimately the catheter tip is sitting in the atriocaval junction in perfect position and the catheter is smoothed and contoured. It aspirates and flushes easily. It is secured to the left arm with StatLock and a sterile dressing and is flushed with heparinized saline. The patient tolerated the procedure well.   ____________________________ Renford DillsGregory G. Maycel Riffe, MD ggs:drc D: 03/22/2012 17:50:59 ET T: 03/23/2012 09:36:22 ET JOB#: [id number]  cc: Renford DillsGregory G. Samary Shatz, MD, <Dictator> Renford DillsGREGORY G [place] Sarinana MD ELECTRONICALLY SIGNED 03/23/2012 11:22

## 2015-04-14 NOTE — H&P (Signed)
PATIENT NAMLenora Boys:  Dehn, Tanner MR#:  [id number] DATE OF BIRTH:  1951-03-03  DATE OF ADMISSION:  05/20/2012  PRIMARY CARE PHYSICIAN:  Not known. Patient was just recently discharged from [facility] after a long rehab.   CHIEF COMPLAINT: Per family patient was sent from dialysis because of low hemoglobin of 7.1.   HISTORY OF PRESENT ILLNESS: Mr. Lenora BoysDonsawath is a 64 year old [place] male who speaks very little English, comes to the emergency room, brought in by family members, with low hemoglobin count at dialysis. The patient does not give much history although does understand basic questions; however, most of the history was obtained from patient's daughter.   Mr. Lenora BoysDonsawath has multiple medical problems, was discharged to [facility] on May 9 after about a 10-day course of hospital stay for GI bleed/anemia workup. He was found to have rectal ulcer on colonoscopy and gastritis on upper gastrointestinal endoscopy, ended up getting 2 units of blood transfusion in early part of May for his GI bleed. He was discharged to [facility] for rehab and just went home 2 days ago. He was continued on his dialysis Tuesdays, Thursdays, and Saturdays. Routine labs at dialysis showed a hemoglobin of 7.1 and he was sent to the emergency room. In the emergency room, the patient has relative low blood pressure of  99 to 108 systolic. He is afebrile. He is not tachycardic. The patient denies any chest pain or abdominal pain. Per daughter there is no major rectal bleed, bright red blood per rectum noted. There is no fever documented at home or at dialysis per daughter. The patient is afebrile here. He was also noted to have leukocytosis with elevated white count of 22,000. He has history of methicillin-resistant Staphylococcus aureus septicemia in March of 2013 treated with IV daptomycin; hence, he received IV vancomycin and Zosyn in the emergency room today. He is being admitted for  evaluation on his anemia and leukocytosis.   PAST MEDICAL HISTORY:  1. Seizures with laceration of the tongue requiring sutures in the past.  2. History of right nephrectomy with possible renal cell carcinoma.  3. MRSA sepsis in March of 2013, source unknown, treated with IV daptomycin for 4 weeks.  4. End-stage renal disease, on hemodialysis. Patient has a left arm AV fistula.  5. Anemia of chronic disease.  6. History of hypertension.   MEDICATIONS:  Per discharge summary of 04/28/2012 are:  1. Sensipar 30 mg p.o. daily.  2. Amlodipine 10 mg daily.  3. Norco 5/325 one tablet q.6 hours as needed.  4. Renvela 800 mg 3 tablets with meals.  5. Keppra 1000 mg at bedtime.  6. Guaifenesin 10 mL every 6 hours as needed.  7. Duke Mouthwash as needed.  8. Lisinopril 10 mg p.o. daily.  9. Ferrous sulfate 325 mg p.o. b.i.d.   10. Procrit 10,000 units IV with dialysis.  11. Omeprazole p.o. b.i.d.   ALLERGIES: No known drug allergies.   SOCIAL HISTORY: Per daughter, lives at home with his wife. Nonsmoker. Nonalcoholic.   FAMILY HISTORY: Not obtainable currently since patient is not able to talk much [place].   REVIEW OF SYSTEMS: Again not much, is limited; however, patient did say he is complaining of: CONSTITUTIONAL: Positive for fatigue. No fever. No weight loss. EYES: No blurred or double vision. ENT: No tinnitus, ear pain, hearing loss. RESPIRATORY: Mild cough, no shortness of breath or hemoptysis. CARDIOVASCULAR: No chest pain, orthopnea, or edema. GASTROINTESTINAL: No nausea, vomiting, diarrhea, or abdominal pain. Positive  for gastroesophageal reflux disease. GENITOURINARY: No dysuria or hematuria. ENDOCRINE: No polyuria or nocturia, thyroid problems. HEMATOLOGY: Positive for anemia. No easy bruising. SKIN: No acne or rash. MUSCULOSKELETAL: Positive for arthritis and weakness. NEUROLOGIC: No cerebrovascular accident or transient ischemic attack. Positive for seizures.  PSYCHIATRIC: No anxiety  or depression. All other systems reviewed are negative.   PHYSICAL EXAMINATION:  GENERAL: The patient is awake. He is alert, oriented x3.    VITAL SIGNS: Pulse is 85, afebrile, blood pressure is 101/58. Saturations are 99% on room air.   HEENT: Atraumatic, normocephalic. PERRLA.  EOMI intact. Generalized pallor present.   NECK: Supple. No JVD. No carotid bruit.   RESPIRATORY: Clear to auscultation bilaterally. No rales, rhonchi, respiratory distress, or labored breathing.   HEART: Both the heart sounds are normal. Rate, rhythm is regular. PMI not lateralized. Chest nontender.   EXTREMITIES: Good pedal pulses, good femoral pulses, feeble pedal pulses. No lower extremity edema.   ABDOMEN: Soft, benign, and nontender. No organomegaly.  NEUROLOGIC: Grossly intact cranial nerves II through XII. No motor or sensory deficits.   PSYCHIATRIC: Patient is awake, alert, oriented x3.   SKIN: Warm and dry. Generalized pallor.   LABORATORIES: Glucose 94, BUN 20, sodium is 133, potassium 3.0, chloride is 96, bicarbonate is 29, calcium is 7.1. White count is 22.6, hemoglobin 7.5 and hematocrit 25.0, platelet count is 485,000. MCV is 82.   CHEST X-RAY: No acute cardiopulmonary abnormality.   ASSESSMENT: A 64 year old Mr Dhanani who is a [place] gentleman comes in with:  1. Acute on chronic anemia, appears due to anemia of chronic disease, most likely due to end-stage renal disease. No active source of bleeding noted at this time. The patient also has history of gastroesophageal reflux disease/gastritis. Hemoglobin today is 7.5. Patient is status post EGD on Apr 27, 2012 with gastritis and colonoscopy showing rectal ulcer. He is on iron at home and Procrit with hemodialysis. We will transfuse 1 unit of packed red blood cells tonight. Gastrointestinal consult if needed. No evidence of active bleeding.  2. Continue PPI. The patient is not on any antiplatelet.  3. Leukocytosis without fever.  Chest x-ray clear. Patient does not make urine. Graft site looks okay.  Source at present is unknown. Patient received Zosyn and vancomycin in the ER. We will follow blood cultures. Continue IV vancomycin for now given history of methicillin-resistant Staphylococcus aureus (MRSA) septicemia in March 2013, treated with 4 weeks of IV daptomycin.  4. End-stage renal disease. We will continue hemodialysis on Tuesday, Thursday, and Saturday.  Have nephrology consultation. Continue Renvela and Sensipar.  5. Relative hypotension. Hold lisinopril and amlodipine for now.  6. History of seizure disorder. Continue Keppra.  7. Renal diet.  8. We will follow up metabolic panel and CBC.  9. TED hose for deep vein thrombosis prophylaxis.  10. Patient's risks and complications for transfusions were discussed earlier by emergency room with family members and patient.  11. Further workup according to the patient's clinical course. The hospital admission plan was discussed with the patient's daughter over the telephone who is agreeable to it.   TIME SPENT: 50 minutes.    ____________________________ Wylie Hail Allena Katz, MD sap:vtd D: 05/20/2012 22:06:16 ET T: 05/21/2012 09:18:21 ET JOB#: [id number]  cc: Yaslyn Cumby A. Allena Katz, MD, <Dictator> Willow Ora MD ELECTRONICALLY SIGNED 05/28/2012 14:13

## 2015-04-14 NOTE — Consult Note (Signed)
PATIENT NAMETyde [name], Jared [name] MR#:  [id number] DATE OF BIRTH:  [birth year]  DATE OF CONSULTATION:  03/11/2012  REFERRING PHYSICIAN:  Dr. Vianne Bulls  CONSULTING PHYSICIAN:  Jadin Creque R. Ma Hillock, MD  REASON FOR CONSULTATION: Patient with ESRD on hemodialysis, admitted with seizure, MRSA sepsis, abnormal CT abdomen with possible spine metastasis.   HISTORY OF PRESENT ILLNESS: The patient is a 64 year old male who was admitted to the hospital on 03/05 with complaints of lower abdominal pain and nausea. The patient was found to have endstage renal disease along with sepsis and is on hemodialysis. He is also status post nephrectomy with history of nephrolithiasis status post lithotripsy and anemia of chronic disease. On hospital day two, the patient reportedly had a seizure causing tongue laceration. Post seizure he went into respiratory failure requiring intubation and was extubated on day nine of intubation. The patient has continued to be intermittently lethargic and not eating well. He also was found to have MRSA bacteremia. CT scans done of the abdomen and pelvis showed an 8-mm pulmonary nodule in the right lower lobe, small subcentimeter possible solid lesion in the left kidney, multiple subtle lucencies in the lumbar spine and pelvis raising suspicion for metastatic disease or myeloma. Oncology has been consulted for this issue.   The patient currently is sleeping, unable to arouse and obtain any history. No family at bedside. Remaining history obtained from chart record/notes on SCM.   PAST MEDICAL HISTORY: 1. Nephrolithiasis.  2. Endstage renal disease on hemodialysis Tuesday, Thursday, and Saturday.  3. Hypertension.   FAMILY HISTORY: Not available.   SOCIAL HISTORY: Reportedly no smoking or alcohol usage.   ALLERGIES: Reportedly no known drug allergies.   CURRENT MEDICATIONS IN HOSPITAL:  1. Keppra 1000 mg IV q. 24 hours.  2. Solu-Medrol 60 mg IV q. 8 hours.  3. Zosyn 3.375 grams IV q.12  hours.  4. Zantac 50 mg IV q. 12 hours.  5. Sliding scale insulin.  6. Albuterol SVN 3 mL q. 4 hours while awake. 7. Tylenol p.r.n. suppository.  8. Morphine 1 mg IV q. 3 to 4 hours p.r.n.  9. Zofran 4 mg IV q. 4 hours p.r.n.  10. Nitroglycerin 0.4 sublingual p.r.n. for chest pain.   REVIEW OF SYSTEMS: Unobtainable.   PHYSICAL EXAMINATION:  GENERAL: The patient is weak, drowsy, and noncommunicative. No acute distress at rest. Thin individual.   VITAL SIGNS:  Temperature 98, pulse 89, respiratory rate 20, blood pressure 128/71, 100% on 2 liters.   HEENT: Normocephalic, atraumatic.  Sclerae anicteric. Sore on lower lip with crusted blood. No obvious thrush.   NECK: Negative for cervical adenopathy.   CARDIOVASCULAR: S1, S2, regular rate and rhythm.   LUNGS: Lungs show bilateral good air entry, decreased at bases.   ABDOMEN: Soft. No hepatosplenomegaly clinically.   EXTREMITIES: No major edema.   LYMPHATICS: No adenopathy in the axillary or inguinal areas.   SKIN: No major bruising or rashes.   LABORATORY, DIAGNOSTIC, AND RADIOLOGICAL DATA: Hemoglobin 7.3, platelets 453, WBC 12,000, ANC 9700. Blood culture from 03/21 growing gram-positive cocci. Magnesium 2, phosphorus 7.2. MET-B from March 20 shows creatinine 7.29, potassium 3.6, calcium 7.8, and albumin 1.8. Recent PSA on 03/05 was 6.3.   IMPRESSION AND RECOMMENDATION: 64 year old gentleman with history of endstage renal disease on dialysis, status post right nephrectomy (no reported history of malignancy per chart record) who has been admitted with complicated medical issues including seizures after which he required mechanical ventilation for respiratory failure. Since extubation he continues to  be lethargic without oral intake. General condition seems poor.  He also has MRSA bacteremia. CT scan shows some abnormalities concerning for malignancy including indeterminate 8-mm nodule in the right lower lobe of the lung, small  subcentimeter possible lesion in the left kidney, subtle lucencies in the lumbar spine and pelvis suspicious for metastatic disease. Recent PSA was slightly abnormal at 6.3. There was some bladder wall thickening also. Unclear if the patient has underlying malignancy with occult metastatic disease, but this is definitely a possibility. We will get serum protein immunofixation electrophoresis to look for M spike for any evidence of myeloma. The patient's general condition is very poor at this time. If his condition improves then further work-up can be obtained including pursuing PET scan or bone scan to evaluate for skeletal metastasis. Would also recommend urology evaluation once his condition improves. If the patient does have occult metastatic malignancy, this would be incurable stage disease and overall prognosis seems poor. No family at bedside to discuss with at this time. Palliative care has been consulted and they are discussing with the patient's family regarding CODE STATUS and goals of treatment. We will continue to follow.   Thank you for the referral. Please feel free to contact me if additional questions.    ____________________________ Rhett Bannister Ma Hillock, MD srp:bjt D: 03/11/2012 23:05:28 ET T: 03/12/2012 07:55:07 ET JOB#: [id number]  cc: Trevor Iha R. Ma Hillock, MD, <Dictator> Alveta Heimlich MD ELECTRONICALLY SIGNED 03/13/2012 8:56

## 2015-04-14 NOTE — Discharge Summary (Signed)
PATIENT NAMLenora [name]:  [name], Jared [name] MR#:  [id number] DATE OF BIRTH:  1951/07/21  DATE OF ADMISSION:  06/23/2012 DATE OF DISCHARGE:  06/28/2012  CONSULTATION: Dr. Wynelle LinkKolluru, nephrologist.   PROCEDURE: Hemodialysis.    DISCHARGE DIAGNOSES:  1. Acute pulmonary edema. 2. Acute on chronic congestive heart failure, diastolic dysfunction, with ejection fraction 55%.  3. End-stage renal disease.  4. Mild aortic stenosis. 5. Anemia.    CONDITION: Stable.   HOME MEDICATIONS: [facility].   ADDITIONAL MEDICATIONS: No.   HOME OXYGEN: No.   DIET: Low sodium renal diet.   ACTIVITY: As tolerated.   FOLLOW-UP CARE: Follow up with PCP within one week. Resume home health. Follow up with Dr. Cherylann RatelLateef for hemodialysis.   HOSPITAL COURSE: Patient is a 64 year old [place] gentleman with multiple medical problems including ESRD on dialysis, hypertension, chronic anemia, chronic abdominal pain presented to the ED with worsening shortness of breath and missing dialysis. Patient was found to be in full blown pulmonary edema or congestive heart failure. He was treated with BiPAP. Patient had positive JVD and significant crackles all over the lung fields and was quite anxious, tachycardic and hypertensive. For a detailed history and physical examination, please refer to the admission note dictated by Dr. Enedina FinnerSona Patel. On admission laboratory data showed white count 14.4, hemoglobin 11.2, BUN 47, potassium 5.4. Chest x-ray showed persistent cardiomegaly with pulmonary edema consistent with congestive heart failure. After admission patient got urgent hemodialysis then placed on routine dialysis. Patient's shortness of breath and congestion got better, however, yesterday patient developed congestion again with shortness of breath needed additional hemodialysis yesterday and today he got routine dialysis in the morning. He has no complaints except cough.   For accelerated hypertension in the  setting of pulmonary edema patient has been treated with Norvasc and lisinopril with hydralazine p.r.n. IV. Blood pressure has been stable.   Chronic anemia, stable.   Abdominal pain. Patient has chronic abdominal pain with history of Clostridium difficile but no diarrhea seen at this admission. Patient has no abdominal pain for the past three days.   ESRD. Patient has been treated with dialysis. Today Patient has no complaints except mild cough.   PHYSICAL EXAMINATION: VITALS: Temperature 97.9, blood pressure 111/69, pulse 87, oxygen saturation 98% on room air. CARDIOVASCULAR: S1, S2 regular rate, rhythm. No murmurs, gallops. PULMONARY: Bilateral air entry. No wheezing or rales.   CONDITION ON DISCHARGE: Patient is clinically stable, will be discharged to home today. As mentioned above will resume home health.   Discussed the patient's situation and discharge plan with the patient, nurse and case manager.   TIME SPENT: About 35 minutes.   ____________________________ Jared PollackQing Annice Jolly, MD qc:cms D: 06/28/2012 15:48:17 ET T: 06/29/2012 10:56:33 ET JOB#: [id number]  cc: Jared PollackQing Elyse Prevo, MD, <Dictator> Jared PollackQING Ophelia Sipe MD ELECTRONICALLY SIGNED 06/30/2012 14:47

## 2015-04-14 NOTE — Consult Note (Signed)
PATIENT NAMLenora [name]:  [name], Jared MR#:  [id number] DATE OF BIRTH:  [birth year]  DATE OF CONSULTATION:  04/24/2012  REFERRING PHYSICIAN:   CONSULTING PHYSICIAN:  Ezzard StandingPaul Y. Jared Beahm, MD  REASON FOR REFERRAL: Severe iron deficiency anemia with rectal bleeding.   HISTORY OF PRESENT ILLNESS: Patient is a 64 year old [place]ian male who has history of end-stage renal disease on dialysis. He also has a history of right nephrectomy due to renal cell carcinoma. Patient was admitted to the hospital from 03/05 to 04/02 for MRSA sepsis. He was brought in because of profound anemia where his hemoglobin was 6.2. Apparently he has been having some rectal bleeding. Unfortunately, patient's English is very limited. I was not able to get much history at all because there is no family members or interpreter around. Apparently there is no prior history of colonoscopy.   PAST MEDICAL HISTORY:  1. As mentioned before chronic renal disease with dialysis Tuesday, Thursday and Saturdays.  2. History of renal cell carcinoma. 3. MRSA bacteremia.   MEDICATIONS AT HOME:  1. Sensipar. 2. Renvela.  3. Lisinopril. 4. Amlodipine.   PAST SURGICAL HISTORY:  1. AV graft. 2. Right nephrectomy.   ALLERGIES: He has no known drug allergies.   FAMILY HISTORY: None.  SOCIAL HISTORY: Negative for tobacco or alcohol use.   REVIEW OF SYSTEMS: Please refer to the initial history and physical dictated by the admitting doctor. There are no changes.   PHYSICAL EXAMINATION:  GENERAL: Patient is in no acute distress.  VITAL SIGNS: Afebrile, temperature 98.4, pulse 93, respirations 20, blood pressure 150/70, pulse ox 98.  HEAD AND NECK: Within normal limits.   CARDIAC: Regular rhythm, rate.   LUNGS: Clear bilaterally.   ABDOMEN: Normoactive bowel sounds, soft. I do not appreciate obvious tenderness anywhere and there is no hepatomegaly.   EXTREMITIES: Presence of AV graft but there is no edema.   LABORATORY, DIAGNOSTIC AND RADIOLOGICAL  DATA: Sodium 139, potassium 3.4, creatinine 2.95, ferritin level 1420. Liver enzymes are normal. White count 11.2, hemoglobin is only 5.6 with hematocrit of 17.6 today.   ASSESSMENT AND RECOMMENDATIONS: This is a patient with profound anemia which is symptomatic. He also has some rectal bleeding. Even though he could have anemia of chronic disease, need to rule out blood loss as the source of anemia. Will try to schedule a colonoscopy for him tomorrow if he can drink the bowel prep and agree to have the procedure done. Hopefully we can find a family member or interpreter who will relay that message to the patient.   Thank you for the referral.   ____________________________ Ezzard StandingPaul Y. Bluford Kaufmannh, MD pyo:cms D: 04/25/2012 13:41:00 ET T: 04/25/2012 13:53:58 ET JOB#: [id number] cc: Ezzard StandingPaul Y. Bluford Kaufmannh, MD, <Dictator> Ezzard StandingPAUL Y Lottie Siska MD ELECTRONICALLY SIGNED 04/25/2012 15:36

## 2015-04-14 NOTE — Consult Note (Signed)
PATIENT NAMEWilly [name], Jared [name] MR#:  [id number] DATE OF BIRTH:  1951/09/15  DATE OF CONSULTATION:  02/25/2012  REFERRING PHYSICIAN:  Enid Baas, MD CONSULTING PHYSICIAN:  Rosalyn Gess. Terrian Sentell, MD  REASON FOR CONSULTATION: Staphylococcus aureus bacteremia.   HISTORY OF PRESENT ILLNESS: The patient is a 64 year old Jared [name] with a past history significant for end-stage renal [name] on hemodialysis and nephrolithiasis who was admitted on 02/23/2012 with abdominal pain and nausea. The patient had missed two dialysis sessions due to his discomfort. He had been to the Emergency Room after the first missed dialysis and a CT scan at that time showed an 8 mm pulmonary nodule in the right lower lobe, a small subcentimeter solid left kidney lesion, as well as nephrolithiasis and peritoneal calcifications. There were also noted to be subtle lucencies throughout the lumbar spine and pelvis and metastatic [name] or myeloma was thought to be possible. He was discharged from the Emergency Room and went back home, but he continued to have abdominal pain and when he presented for his next dialysis he was sent to the Emergency Room. He was admitted to the hospital and on the subsequent day had a seizure with a severe laceration of the tongue. There was concern that he had aspirated a significant amount of blood and he required intubation. He also underwent surgical repair of the tongue laceration. He currently remains on the ventilator and is unable to provide any history. The history is obtained exclusively from the chart. Per the chart, he had denied any fevers but had some chills and lower abdominal pain. He had also had some dysuria. He had been having nausea and vomiting as well. The patient was initially given levofloxacin. Blood cultures have grown staph aureus and he has been started on vancomycin as well. He was given a dose of ertapenem x1. He has had a subsequent seizure since his initial seizure and remains  on pressors and sedation in the Critical Care Unit.   ALLERGIES: No known drug allergies.   PAST MEDICAL HISTORY:  1. Nephrolithiasis.  2. Hypertension.  3. End-stage renal [name] on hemodialysis.  4. Status post unilateral nephrectomy.   SOCIAL HISTORY: Per the history and physical, the patient does not smoke. He does not drink. He has no history of illicit drug use.   FAMILY HISTORY: Unable to obtain from the patient.   REVIEW OF SYSTEMS: Unable to obtain from the patient.   PHYSICAL EXAMINATION:   VITAL SIGNS: T-max 101.2, pulse 86, blood pressure 103/53, and 100% on the ventilator. Vent settings include assist control with rate of 12, spontaneous rate of 6, tidal volume of 474, minute ventilation of 9.0, FiO2 of 30%, and PEEP of 5.  GENERAL: Critically ill 64 year old Jared [name] on the ventilator.   HEENT: Normocephalic, atraumatic. Unable to assess extraocular motion. Sclerae, conjunctivae, and lids are without evidence for emboli or petechiae. No scleral icterus. A NG tube is in place and an endotracheal tube is in place as well.   NECK: Midline trachea. No lymphadenopathy. No thyromegaly.   LUNGS: Clear to auscultation bilaterally with good air movement. No focal consolidation.   HEART: Regular rate and rhythm without murmur, rub, or gallop.   ABDOMEN: Soft and nondistended. No hepatosplenomegaly. No hernia is noted.   EXTREMITIES: No evidence for tenosynovitis.   SKIN: No rashes. No stigmata of endocarditis, specifically no Janeway lesions or Osler nodes.   NEUROLOGIC: The patient is unresponsive on the ventilator.   PSYCHIATRIC: Unable to assess.  LABS/STUDIES: BUN 101, creatinine 19.05, bicarbonate 23, anion gap 22. LFTs from admission showed an AST of 52, ALT 73, alkaline phosphatase 340, and total bilirubin 1.8. White count from today is 29.3 with a hemoglobin of 10.2, platelet count 210, and ANC of 25.8. White count on admission was 22.9. Several days earlier  in the Emergency Room his white count was 13.9.   Blood cultures from admission are growing Staphylococcus aureus in four out of four bottles.   A urinalysis from admission had negative nitrites, 3+ leukocyte esterase, 28 red cells and 243 white cells.   It does not appear that a urine culture was obtained.  Hepatitis B surface antibody was 1.06.  A CT scan from the Emergency Room on 02/20/2012 was as described in the history and physical.   A repeat CT scan from admission showed bladder wall thickening and adjacent inflammatory changes that were nonspecific. Cystitis or malignancy were possible. There was also noted a 7 mm nodule in the right lower lobe.   A CT scan of the head without contrast showed multiple areas of subacute to chronic regions of lacunar infarction. Also findings which could represent small vessel and ischemic changes.  A chest x-ray showed no infiltrates following intubation.   IMPRESSION: A 64 year old Jared [name] on hemodialysis and nephrolithiasis admitted with abdominal pain who developed new onset seizures, tongue laceration and probable blood aspiration requiring intubation who has Staphylococcus aureus bacteremia.   RECOMMENDATIONS:  1. We will continue vancomycin until the staph aureus sensitivities return.  2. His CT scan shows a subcentimeter pulmonary lesion and findings in the bone that could represent malignancy but are not specific. It is unclear why he began having seizures. Possibilities include metabolic derangement from missing hemodialysis, metastatic [name] to the brain, or Staphylococcus abscesses to the brain. Noncontrasted head CT showed no shift or obvious mass. He is intubated and cannot go for MRI. I would get a CT scan of the head with contrast. This may need to be timed with nephrology around hemodialysis.  3. His respiratory issues began after he presumably aspirated blood. I would not expect this to be  infectious but rather a chemical pneumonitis. He is only been requiring 30% FiO2. Moreover carbapenems can lower the seizure threshold and given his renal failure ertapenem may make him more likely to have further seizures. We will discontinue the ertapenem.   4. Will place him on contact isolation until the staph sensitivities return.  5. On admission he had mild transaminitis. [place] has a high level of endemic hepatitis B infection. His hepatitis B surface antibody was positive. It is unclear whether this was from vaccination or natural infection, but regardless this makes active hepatitis B an unlikely etiology of his abdominal pain. We will send hepatitis C serologies, although I am not sure if this would have played a role in his initial abdominal discomfort.   This is a high-level infectious [name] consult. Thank you very much for involving me in Mr. Lynden AngDonsawath's care.  ____________________________ Rosalyn GessMichael E. Risa Auman, MD meb:slb D: 02/25/2012 15:22:08 ET T: 02/25/2012 15:44:59 ET JOB#: [id number]  cc: Rosalyn GessMichael E. Jeffre Enriques, MD, <Dictator> Charmain Diosdado E Malaina Mortellaro MD ELECTRONICALLY SIGNED 03/01/2012 10:10

## 2015-04-14 NOTE — Consult Note (Signed)
PATIENT NAMLenora Boys:  Spilman, Jamol MR#:  [id number] DATE OF BIRTH:  05-Sep-1951  DATE OF CONSULTATION:  02/24/2012  REFERRING PHYSICIAN:   CONSULTING PHYSICIAN:  Kyung Ruddreighton C. Amela Handley, MD  CHIEF COMPLAINT: Tongue laceration.   HISTORY OF PRESENT ILLNESS: The patient is a 64 year old male with history of increasing respiratory distress who was undergoing dialysis and had a seizure. During that he bit the emergency department of his tongue with a significant laceration of the tip of his tongue. He developed worsening shortness of breath and probable aspiration of blood and required intubation secondary to respiratory compromise. I was asked for emergent evaluation of his tongue with closure of his laceration for improvement and reduction of his bleeding.   PAST MEDICAL HISTORY:  1. Nephrolithiasis. 2. End-stage renal disease, on hemodialysis. 3. Hypertension. 4. Possible myocardial infarction for which he is being worked up for her.   PAST SURGICAL HISTORY: Unknown, none apparently on the head and neck.   SOCIAL HISTORY: The patient has no history in the computer of smoking, alcohol abuse, or illicit drug use.   ALLERGIES: No known drug allergies.   FAMILY HISTORY: There is none in the computer and the patient is currently obtunded and sedated.  PHYSICAL EXAMINATION:   HENT: An intubated and sedated male in no acute distress. Endotracheal tube is secure and in place. He has active bleeding coming from his oral cavity. Oral cavity evaluation reveals a laceration of his tongue involving the right anterior third of the tongue. This laceration extends just across the midline and involves approximately 50% depth. There is continued active bleeding from the laceration site and includes the lateral edge of the right tongue. There is no through and through laceration. There is significant edema of the posterior as well as anterior tongue.   EARS: EACs are clear.   NECK: Supple. No lymphadenopathy or  thyromegaly noted. Clear to anterior examination.   IMPRESSION: Severe tongue laceration with resultant respiratory compromise due to significant bleeding.   PLAN: Complex laceration repair was loosely performed at bedside.   The plan is to keep the anterior tip of his tongue moist. Given his significant edema, he will require intubation or at least airway protection until the swelling has gone down. He may benefit from a tracheostomy tube if the edema persists. ____________________________ Kyung Ruddreighton C. Maleaha Hughett, MD ccv:slb D: 02/24/2012 18:20:53 ET T: 02/25/2012 09:54:37 ET JOB#: [id number]  cc: Kyung Ruddreighton C. Vivianne Carles, MD, <Dictator> Kyung RuddREIGHTON C Beckhem Isadore MD ELECTRONICALLY SIGNED 02/25/2012 11:43

## 2015-04-14 NOTE — Consult Note (Signed)
Patient admitted to CCU after dialysis.  Had seizure, now intubated, sedated.  Hypotensive with poor venous access.  Asked to place line.  Jugulars access would not pass wires from either side, so femoral catheter placed.    Electronic Signatures: Annice Needyew, Nicholai Willette S (MD)  (Signed on 06-Mar-13 14:34)  Authored  Last Updated: 06-Mar-13 14:34 by Annice Needyew, Deniece Rankin S (MD)

## 2015-04-14 NOTE — H&P (Signed)
PATIENT NAMLenora [name]:  Muns, Jared [name]#:  [id number] DATE OF BIRTH:  Sep 30, 1951  DATE OF ADMISSION:  02/23/2012  PRIMARY CARE PHYSICIAN: Does not have one.   CHIEF COMPLAINT: Lower abdominal pain, nausea.   HISTORY OF PRESENT ILLNESS: The patient is a 64 year old male who presents to the hospital with lower abdominal pain, nausea, and unable to urinate. The patient has a history of end-stage renal disease on hemodialysis and does not make much urine but has been complaining that he is having some difficulty urinating along with some dysuria. He denies any fevers but admits to some chills, and he describes a lower abdominal pain. The patient also complains of very atypical chest pain and shoulder pain which is ongoing for a very long time. He also admits to nausea and a few episodes of vomiting which was bilious and nonbloody in nature over the past few days. The patient is strictly [place] speaking, therefore, most of the history is obtained with the help of the daughter who speaks English at bedside. The patient also has missed his dialysis since last Thursday. When the patient presented to the hospital, he was also noted to have some subtle EKG changes on the inferior leads compared to his previous EKG, although he had normal troponins. His CT scan was suggestive of  bladder wall thickening consistent with cystitis. Hospitalist Service was then contacted for further treatment and evaluation.   REVIEW OF SYSTEMS: CONSTITUTIONAL: No documented fever. No weight gain, no weight loss. EYES: No blurred or double vision. ENT: No tinnitus, no postnasal drip. No redness of the oropharynx. RESPIRATORY: No cough, no wheeze, no hemoptysis, no dyspnea. CARDIOVASCULAR: Positive chest pain-chronic, no orthopnea, no palpitations, no syncope. GASTROINTESTINAL: Positive nausea. Positive vomiting. No diarrhea. Positive hypogastric abdominal pain. No melena, no hematochezia. GU: Positive dysuria, no hematuria, positive difficult  urinating. ENDOCRINE: No polyuria, nocturia. No heat or cold intolerance HEMATOLOGIC/LYMPHATIC: No anemia, no bruising, no bleeding. INTEGUMENTARY: No rashes. No lesions. MUSCULOSKELETAL: No arthritis, no swelling, and no gout. NEUROLOGIC: No numbness. No tingling. No ataxia. No seizure-type activity. PSYCHIATRIC: No anxiety, no insomnia, no ADD.   PAST MEDICAL HISTORY:  1. History of nephrolithiasis. 2. History of end-stage renal disease, on hemodialysis Tuesday, Thursday and Saturday.  3. Hypertension.   ALLERGIES: No known drug allergies.   SOCIAL HISTORY: No smoking. No alcohol abuse. No illicit drug abuse.   FAMILY HISTORY: He cannot recall his family history.   CURRENT MEDICATIONS:  1. Amlodipine 10 mg daily.  2. Lisinopril 20 mg daily.  3. Norco 5/325, 1 tab every 6 hours as needed for pain.  4. Renvela  800 mg, 3 tabs daily with meals. 5. Sensipar 30 mg daily.   PHYSICAL EXAMINATION:  VITAL SIGNS: On admission, temperature is 96.3, pulse 92, respirations 20, blood pressure 141/68, saturations 100% on room air.   GENERAL: He is a pleasant-appearing male in mild distress.   HEENT: Atraumatic, normocephalic. Extraocular muscles are intact. Pupils are equal and reactive to light. Sclerae are anicteric. No conjunctival injection. No pharyngeal erythema.   NECK: Supple. There is no jugular venous distention, no bruits, no lymphadenopathy or thyromegaly.   HEART: Regular rate and rhythm. No murmurs, no rubs, no clicks.   LUNGS: Clear to auscultation bilaterally. No rales, no rhonchi, no wheezes.   ABDOMEN: Soft, flat, nontender, nondistended. Has good bowel sounds. No hepatosplenomegaly appreciated.   EXTREMITIES: There is no evidence of any cyanosis, clubbing, or peripheral edema. Has +2 pedal and radial pulses bilaterally. He has  a right upper extremity AV fistula which has a good bruit and good thrill with no evidence of any drainage or any inflammation.   NEUROLOGICAL: He is  alert, awake, and oriented x3 with no focal motor or sensory deficits appreciated bilaterally.   SKIN: Moist and warm with no rash appreciated.   LYMPHATIC: There is no cervical or axillary lymphadenopathy.   LABORATORY, DIAGNOSTIC, AND RADIOLOGICAL DATA:  Serum glucose 116, BUN 123, creatinine 19.9, sodium 133, potassium 6.4, chloride 90, bicarbonate 21.  The patient's liver function tests are within normal limits.  Troponin 0.05.  White cell count 22.9, hemoglobin 12.1, hematocrit 36.6, platelet count 156.  The patient did have a CT scan of the abdomen and pelvis done without contrast showing bladder wall thickening and adjacent inflammatory changes. Findings are suggestive of cystitis. Malignancy could have a similar appearance. An indeterminate 7 mm nodule in the right lower lobe.   ASSESSMENT AND PLAN: The patient is a 64 year old [place] male with past medical history of hypertension, end-stage renal disease, on hemodialysis, history of secondary hyperparathyroidism, presents to the hospital due to nausea, vomiting and lower abdominal pain. The patient has also missed his dialysis since  past Thursday.   1. Lower abdominal pain with nausea and vomiting: The exact etiology of this is currently unclear. The patient has had two CT scans in the past three days which do not suggest any acute intra-abdominal pathology other than cystitis. He has been complaining of difficulty urinating and dysuria. Although he is on dialysis, I will go ahead and place a Foley catheter in him. I will check his urinalysis. I will empirically start him on IV Levaquin and follow his cultures for now. He has not been complaining of any diarrhea. Therefore, it is unlikely this is related to any other intra-abdominal pathology at this point. We will follow him clinically with  treatment with antibiotics for his possible cystitis and urinary tract infection.  2. EKG changes: The patient presented to the hospital  with very vague symptoms of atypical chest pain. He did have some ST changes in the inferior leads, although his troponins are negative. I will observe him overnight on telemetry, get a Cardiology consult, and get a two-dimensional echocardiogram. I will continue aspirin, nitroglycerin, and oxygen for now. I will hold off on any beta blockers or statin at this time.  3. End-stage renal disease, on hemodialysis: The patient has not gotten dialysis since last Thursday. I discussed it with Nephrology. They do not think that the patient needs urgent dialysis at this time. They will dialyze him first thing in the morning tomorrow: Continue Nephrology consult at this point.  4. Hyperkalemia:  I will go ahead and treat this with Kayexalate for now and follow his potassium.  5. Hypertension: He is presently hemodynamically stable. Continue Norvasc and lisinopril.  6. Secondary hyperparathyroidism:  Continue Sensipar and Renvela for now.   CODE STATUS:  The patient is a FULL CODE.     TIME SPENT WITH ADMISSION:  60 minutes.   ____________________________ Rolly Pancake. Cherlynn Kaiser, MD vjs:cbb D: 02/23/2012 18:05:46 ET T: 02/23/2012 18:48:00 ET JOB#: [id number]  cc: Rolly Pancake. Cherlynn Kaiser, MD, <Dictator> Houston Siren MD ELECTRONICALLY SIGNED 02/25/2012 8:15

## 2015-04-14 NOTE — Consult Note (Signed)
PATIENT NAMLenora Boys:  Chiles, Daveyon MR#:  [id number] DATE OF BIRTH:  12/25/1950  DATE OF CONSULTATION:  03/10/2012  REFERRING PHYSICIAN:   CONSULTING PHYSICIAN:  Scot Junobert T. Elliott, MD  HISTORY OF PRESENT ILLNESS: Patient is a 64 year old Laotian who and was admitted to the hospital after being seen in the ER for abdominal pain, nausea and vomiting. He has a history of hypertension and end-stage renal disease. He in the ER was noted to be febrile and had leukocytosis, nausea, vomiting and abdominal pain. Blood and urine cultures were obtained and he was admitted to the hospital. Because of deterioration he was transferred to the Intensive Care Unit. Studies revealed MRSA. He was covered with vancomycin. Consultation was obtained with Dr. Leavy CellaBlocker. Echocardiogram was done. This was negative for valvular vegetations. AV fistula did not appear to be infected. How that determination was made I am not sure.    The patient had a seizure and lacerated his tongue with biting. This was repaired by ENT, Dr. Bud Facereighton Vaught. Since then patient has been taking nourishment poorly. They have tried NG tube insertion, this has been pulled out at least three times therefore I was consulted for possible PEG.   REVIEW OF SYSTEMS: Unobtainable because of language barrier. I do not speak [place]ian. He does not speak [place] and no one from the family or a [place] interpreter is around at this time.   PHYSICAL EXAMINATION: GENERAL: Laotian gentleman lying in bed, asleep, arousable.   VITAL SIGNS: Temperature 100.3, blood pressure 162/75, pulse 120, 91% sat. The current temperature is the maximum for the last 24 hours.   HEENT: Sclerae questionably icteric. Conjunctiva somewhat pale. The head is atraumatic. I cannot induced him to stick his tongue out to examine his tongue. Part of his tongue was seen between his teeth. There is no visible scab or bleeding.   CHEST: Chest shows some upper respiratory expiratory sounds. No rales  that I can hear.   HEART: 1 to 2/6 short systolic murmur.   ABDOMEN: Flat, nondistended, nontender. No hepatosplenomegaly. No masses. No bruits. No previous surgical scars are noted on the abdomen in the mid and upper abdomen.   EXTREMITIES: No edema.   SKIN: Warm and dry.   LABORATORY, DIAGNOSTIC AND RADIOLOGICAL DATA: Phosphorus 7.2, magnesium 2. Yesterday labs show sodium 138, potassium 3.6, chloride 96, CO2 28, calcium 7.8, phosphorus 6.1, magnesium 1.9, albumin 1.8, white blood count 13.1, hemoglobin 7.9, platelet count 551. Chest x-ray today shows no acute disease of the chest. Chest x-ray done on the 17th showed shallow inspiration without evidence of acute cardiopulmonary disease.   ASSESSMENT/RECOMMENDATIONS: Patient with significant malnutrition, multiple serious medical problems including end-stage kidney disease on dialysis, staph septicemia/bacteremia, difficulty eating because of a tongue injury from seizure. He would likely be a reasonable candidate for a PEG placement. It is possible that he may pull the PEG tube out. It would be important to put in the T-wire system with four fastening T-wires that would hold the stomach against the abdominal wall so that removal of the feeding tube would be quite difficult. I would, of course, prefer to talk to his family with an interpreter. Will try to arrange this for tomorrow or Monday.   ____________________________ Scot Junobert T. Elliott, MD rte:cms D: 03/10/2012 19:56:48 ET T: 03/11/2012 05:23:40 ET JOB#: [id number]  cc: Scot Junobert T. Elliott, MD, <Dictator> Rosalyn GessMichael E. Blocker, MD Kyung Ruddreighton C. Vaught, MD Scot JunOBERT T ELLIOTT MD ELECTRONICALLY SIGNED 03/17/2012 10:42

## 2015-04-14 NOTE — Consult Note (Signed)
Impression: 64yo [place] male w/ h/o ESRD on HD, nephrolithiasis admitted with abd pain who developed new onset seizures with tongue laceration and probable blood aspiration requiring intubation who has Staph aureus bacteremia.  Continue vanco until the Staph aureus sensitivities return. His CT scans show a subcentimeter pulmonary lesion and findings in the bone that could represent malignancy, but are not specific.  Unclear why he began having seizures.  Possibilities include: metabolic derangement from missing HD, metastatic disease to the brain, Staph abscess in the brain.  Noncontrast head CT showed no shift and no obvious mass.  He is intubated and cannot go for MRI.  Would get CT of the head with contrast.  This may need to be timed with nephrology around HD. His respiratory issues began after he presumedly aspirated blood.  I would not expect this to be infectious, but rather a chemical pneumonitis.  He is only requiring 30% FIO2.  Moreover, carbapenem can lower the seizure threshold and given his renal failure, ertapenem may make him more likely to have further seizures.  Will d/c the ertapenem. Will place him on contact isolation until the Staph sensitivities return.   On admission, he had mild transaminitis.  [place] has a high level of endemic Hep B infection.  His Hep B surface AB was positive.  Will send hep C serology.  I am not sure if this would have played a role in his abd discomfort.   Electronic Signatures: Tj Kitchings, Rosalyn GessMichael E (MD) (Signed on 07-Mar-13 15:07)  Authored   Last Updated: 07-Mar-13 15:21 by Caro Brundidge, Rosalyn GessMichael E (MD)

## 2015-04-14 NOTE — Op Note (Signed)
PATIENT NAMLenora Boys:  [name], Jared [name] DATE OF BIRTH:  [birth year]  DATE OF PROCEDURE:  02/24/2012  PREOPERATIVE DIAGNOSIS: Tongue laceration.   POSTOPERATIVE DIAGNOSIS: Tongue laceration.   PROCEDURE PERFORMED: Complex closure of tongue laceration.   SURGEON: Kyung Ruddreighton C. Lasasha Brophy, MD   ANESTHESIA: General endotracheal anesthesia.   ESTIMATED BLOOD LOSS: 50 mL.   IV FLUIDS: None.   COMPLICATIONS: None.   DRAINS/STENT PLACEMENTS: None.   SPECIMENS: None.   INDICATIONS FOR PROCEDURE: The patient is a 64 year old male with history of tongue laceration following a tonic-clonic seizure and airway compromise secondary to bleeding.   OPERATIVE FINDINGS: Laceration of the right anterior tongue extending across midline and approximately 50% of the depth with active bleeding.   DESCRIPTION OF PROCEDURE: The patient was emergently seen in ICU. The patient was noted to have continuing bleeding from his oral cavity. At this time the patient was prepped and draped in a sterile fashion and using a tonsil clamp, a side-biting mouth gag was inserted in the patient's oral cavity for retraction of his mouth. This revealed a large laceration of his right anterior tongue that extended to the midline. It was not through and through although it did involve the lateral edge on the right side, did cross midline. There was a moderate amount of edema of the anterior and posterior tongue. There was active bleeding on both edges of the laceration site. At this time, 4-0 Vicryl sutures were used in an interrupted fashion for reapproximation of the deep layers of the tongue. Multiple sutures were placed. This helped with hemostasis and bleeding was controlled. At this time horizontal mattress sutures were placed loosely for reapproximation of the laceration edges. Care was taken to continue to allow some expression of a mild amount of oozing. This was carried across midline for reapproximation of all laceration  edges. At this time, the tonsil clamp was released and his tongue was attempted to be placed back into his mouth at this time. There was a moderate amount of edema of the tongue preventing the tip from re-entering his oral cavity. This was moistened and dressed with sterile gauze.    ____________________________ Kyung Ruddreighton C. Tonnie Stillman, MD ccv:drc D: 02/24/2012 18:24:48 ET T: 02/25/2012 09:47:27 ET JOB#: [id number]  cc: Kyung Ruddreighton C. Kimothy Kishimoto, MD, <Dictator> Kyung RuddREIGHTON C Franki Alcaide MD ELECTRONICALLY SIGNED 02/25/2012 11:43

## 2015-04-14 NOTE — H&P (Signed)
PATIENT NAMEAlhassan [name], Jared [name] MR#:  [id number] DATE OF BIRTH:  [birth year]  DATE OF ADMISSION:  06/23/2012  PRIMARY CARE PHYSICIAN: Unknown.   NEPHROLOGIST: [facility]  CHIEF COMPLAINT: Shortness of breath. Very limited history given language barrier. The patient speaks [name]. No language line or interpreter is available at this time. I did talk with the patient's daughter, Jared [name], telephone number [phone], and she did provide some of the history.   HISTORY OF PRESENT ILLNESS: Jared [name] is a 64 year old [name] gentleman with multiple medical problems including end-stage renal disease on hemodialysis who comes to the Emergency Room by EMS after he started having increasing shortness of breath since yesterday and feeling worse today with significant difficulty with shortness of breath. The patient did not go to dialysis today, per daughter. EMS brought the patient over here. He was found to be in full blown pulmonary edema/congestive heart failure. He is currently on BiPAP and saturating 100%, however, he does have positive JVD and significant crackles all over the lung fields and is quite anxious, tachycardic, and hypertensive. He is being admitted for further evaluation and management for acute flash pulmonary edema, accelerated hypertension, and congestive heart failure. The patient has an ejection fraction of more than 55% from echocardiogram in May 2013. He has mild valvular aortic stenosis and moderate aortic regurgitation.   PAST MEDICAL HISTORY:  1. Seizures with laceration of the tongue requiring sutures in the past.  2. History of right nephrectomy with possible renal cell cancer.  3. Methicillin-resistant Staphylococcus aureus sepsis in March 2013.  4. End-stage renal disease on hemodialysis. He has left arm AV fistula.  5. Anemia of chronic disease.  6. History of hypertension.  7. Most recent admission in June with C. difficile colitis.  8. History of chronic  abdominal pain.   ALLERGIES: No known drug allergies.   SOCIAL HISTORY: Lives at home with his wife, nonsmoker, nonalcoholic.   FAMILY HISTORY: Unobtainable.   REVIEW OF SYSTEMS: Again much is limited secondary to the patient with language barrier. He only complains of shortness of breath.   MEDICATIONS: (According to the discharge summary of 05/27/2012) 1. Norco 5/325 mg 1 tablet p.o. every 6 hours p.r.n.  2. Norvasc 10 mg daily.  3. Sensipar 30 mg daily.  4. Renvela 800 mg 3 tablets with meals.  5. Keppra 1000 mg at bedtime.  6. Dukes Magic mouthwash 10 mL every 6 hours p.r.n.  7. Guaifenesin 5 mL every six hours.  8. Lisinopril 10 mg daily.  9. Ferrous sulfate 325 mg twice a day.  10. Omeprazole 20 mg twice a day.  PHYSICAL EXAMINATION: The patient appears to be in respiratory distress.   VITAL SIGNS: He is afebrile. Pulse is 109, blood pressure 156/87, and saturation 100% on BiPAP.   GENERAL: He is a thin, lean critically ill appearing patient.   HEENT: Atraumatic, normocephalic. Pupils are equal, round, and reactive to light and accommodation. Extraocular movements intact. Oral mucosa is dry. The patient has BiPAP at present.   NECK: Supple. Positive JVD.  RESPIRATORY: Coarse crackles present all over the lung fields. Decreased breath sounds in the bases. Positive use of accessory muscles.  CARDIOVASCULAR: Tachycardia present. No murmur heard. PMI not lateralized. Chest is nontender.   EXTREMITIES: Pitting edema, 1+ in both lower extremities. Feeble pedal pulses. Good femoral pulses.   ABDOMEN: Soft, benign, and nontender. No organomegaly. Positive bowel sounds.   NEUROLOGIC: The patient moves all extremities well. Again limited exam at present given  respiratory distress.   SKIN: Warm and dry.   LABORATORY, DIAGNOSTIC AND RADIOLOGIC DATA: Chest x-ray is consistent with congestive heart failure.   White count 14.4, hemoglobin and hematocrit 11.2 and 36.5, and  platelet count 357. Glucose 120, BUN 49, potassium 5.4, bilirubin 3.7, alkaline phosphatase 737, SGOT 53, and albumin 2.5.   EKG shows normal sinus rhythm with nonspecific ST-T changes.   ASSESSMENT: A 64 year old [place]ian male with history of end-stage renal disease on hemodialysis, hypertension, recent MRSA sepsis, C. difficile colitis, and seizure disorder admitted for acute flash pulmonary edema. The patient is being admitted with:  1. Acute flash pulmonary edema with severe respiratory failure due to congestive heart failure, appears diastolic. Echo in May 2013 showed ejection fraction of 55% with mild aortic stenosis and mild to moderate aortic regurgitation with elevated right ventricular systolic pressure. The patient needs urgent hemodialysis. We informed Dr. Lamont DowdySarath Kolluru. His saturations are 100% on BiPAP. He is in distress. We will start on nitro and p.r.n. IV morphine. The patient did receive a dose of Lasix in the Emergency Room. Echo in May 2013 showed moderate AI, mild AS, and ejection fraction of 55%. We will not repeat an echo at this time. Cycle cardiac enzymes x3.  2. Accelerated hypertension in the setting of pulmonary edema. Continue home medications, which are Norvasc and lisinopril. We will give orders for p.r.n. hydralazine.  3. Chronic anemia due to multiple medical problems. He had an EGD in May 2013 with gastritis and colonoscopy showing rectal ulcer. The patient is on iron pills and Procrit with his hemodialysis. His hemoglobin and hematocrit is stable.  4. Abdominal pain with history of C. difficile colitis. Completed Flagyl about three weeks ago. Continue to monitor.  5. End-stage renal disease with hemodialysis and hyperkalemia. The patient received calcium gluconate and dextrose plus insulin in the ER. He is on dialysis Tuesday, Thursday, and Saturday. He will get urgent dialysis tonight. We will continue Renvela and Sensipar. 6. History of seizure disorder, on Keppra. We  will continue to follow. Seizure precautions.   The patient will be admitted to the Critical Care Unit and placed on a 2 gram sodium diet along with renal diet. He is a FULL CODE. Further work-up pending on the patient's clinical course. The hospital admission plan was discussed with the patient's daughter, Jared [name], over the telephone. No family in the ER.   CRITICAL TIME SPENT: 60 minutes. ____________________________ Wylie HailSona A. Allena KatzPatel, MD sap:slb D: 06/23/2012 18:21:41 ET T: 06/24/2012 09:22:49 ET JOB#: [id number]  cc: Elene Downum A. Allena KatzPatel, MD, <Dictator> Willow OraSONA A Theron Cumbie MD ELECTRONICALLY SIGNED 06/24/2012 17:13

## 2015-04-14 NOTE — Consult Note (Signed)
Brief Consult Note: Comments: Pt in dialysis.  Chart reviewed- will check back later No family in room on recheck- will see tomorrow when interpreter here.  Left word for nursing to page me.  Electronic Signatures: Riki AltesStoioff, Scott C (MD)  (Signed 26-Mar-13 18:23)  Authored: Brief Consult Note   Last Updated: 26-Mar-13 18:23 by Riki AltesStoioff, Scott C (MD)

## 2015-04-14 NOTE — Consult Note (Signed)
    General Aspect patient is a 64 year old [place]ian male with history of end-stage renal disease on hemodialysis.  He presented to the emergency room with complaints of lower abdominal discomfort.  It is of note he missed a dialysis appointment.  He was noted to be hyperkalemic.  His electrocardiogram revealed ST elevation in the inferior leads.  This was new from previously.  He had no complaints of chest pain.  Serum troponin was unremarkable.  Patient underwent hemodialysis but developed a seizure x2.  This was complicated by intraparenchymal laceration.  He currently is intubated and unable to give history.  He is hemodynamically stable.  His sternal troponin subsequent is 0.07.  Serum potassium continues to be markedly elevated at 7.2.  echocardiogram reveals preserved left ventricular function with no obvious wall motion abnormality noted.   Physical Exam:   GEN critically ill appearing    NECK supple    RESP rhonchi  breathing with the ventilator    CARD Regular rate and rhythm  Tachycardic  No murmur    ABD normal BS    LYMPH negative neck    EXTR positive edema    SKIN normal to palpation    NEURO post ictal    PSYCH intubated   Review of Systems:   ROS Pt not able to provide ROS    Medications/Allergies Reviewed Medications/Allergies reviewed     Previious Blood Transfusion:    Secondary hyperparathyrodism:    Kidney Stones:    Dialysis:    Hypertension:    Renal Failure:    Dialysis catheter left chest:    Removal of AV graft left arm:    Failed dialysisi access left arm:   EKG:   Interpretation sinus rhythm with ST elevation in the inferior leads    No Known Allergies:     Impression 64 year old [place] male with history of end-stage renal disease on hemodialysis.  He was admitted with abdominal pain and hyperkalemia.  He suffered several seizures today is currently intubated status post tongue laceration.  His serum potassium  continues to be 7.2.   his electrocardiogram is certainly concerning for myocardial infarction however the patient is having no chest pain and his  troponin is minimally elevated at 0.07 despite end-stage renal disease.  His EKG changes may be related to his potassium level however these changes are not classic for her hyperkalemia.  This point he is not a candidate for invasive cardiac evaluation.  His echocardiogram did not reveal significant wall motion abnormality at this point.  We'll need to continue to treat his respiratory failure as well  as etiology of his seizure disorder.    Plan 1.  Aggressive treatment of respiratory failure and weaned down as tolerated 2.  Treatment of his seizure disorder and determination of etiology 3.  Hemodialysis to treat his hyperkalemia 4.  Follows her troponin and electrocardiograms.  Further cardiac evaluation will depend on further course however this point, the patient does not appear to be having acute myocardial infarction despite electrocardiographic tracing images.   Electronic Signatures: Dalia HeadingFath, Kassius Battiste A (MD)  (Signed 06-Mar-13 16:52)  Authored: General Aspect/Present Illness, History and Physical Exam, Review of System, Past Medical History, EKG , Allergies, Impression/Plan   Last Updated: 06-Mar-13 16:52 by Dalia HeadingFath, Calise Dunckel A (MD)

## 2015-04-14 NOTE — Discharge Summary (Signed)
PATIENT NAMLenora [name]:  Velarde, Jared MR#:  [id number] DATE OF BIRTH:  November 13, 1951  DATE OF ADMISSION:  05/20/2012 DATE OF DISCHARGE:  05/27/2012   ADMISSION DIAGNOSES:  1. Acute on chronic anemia.  2. Abdominal pain.   DISCHARGE DIAGNOSES:  1. Acute on chronic anemia, status post 2 units of packed red blood cells.  2. Abdominal pain with Clostridium difficile colitis.  3. Healthcare associated pneumonia.  4. End-stage renal disease, on hemodialysis.  5. Relative hypotension, improved.  6. History of seizure disorder.   CONSULT: Dr. Mosetta PigeonHarmeet Singh    PERTINENT LABORATORIES AT DISCHARGE: White blood cells 11.7, hemoglobin 10, hematocrit 31.7, platelets 319, sodium 134, potassium 3.9, chloride 97, bicarb 30, BUN 22, creatinine 4.43, glucose 86. Abdominal three-way showed no evidence of obstruction. Occult blood feces negative. Clostridium difficile was positive. Blood cultures are negative to date.   HOSPITAL COURSE: This is a 64 year old male who presented with acute on chronic anemia. For further details, please refer to the history and physical.  1. Acute on chronic anemia secondary to chronic disease. The patient is status post 2 units of PRBCs. He had EGD 04/27/2012 with gastritis and colonoscopy showing rectal ulcer. The patient is on iron tablets at home and Procrit with hemodialysis.  2. Abdominal pain with colitis. The patient is on Flagyl. Three-way abdominal x-ray showed no obstruction. No obvious signs of ileus. 3. Healthcare associated pneumonia. Finished six days of IV vancomycin. Blood cultures negative to date. The patient is breathing comfortably on room air.  4. End-stage renal disease, on hemodialysis. He did receive dialysis while in the hospital.  5. Relative hypotension, improved.  6. History of seizure disorder. The patient is on Keppra. There were no seizures during this hospitalization.   DISCHARGE MEDICATIONS:  1. Norco 5/325 q.6 hours p.r.n. pain.  2. Norvasc 10 mg daily.   3. Sensipar 30 mg daily.  4. Renvela 800 mg 3 tablets with meals.  5. Keppra 1000 mg at bedtime.  6. Duke's Magic Mouthwash 10 mL q.6 hours p.r.n. pain.  7. Guaifenesin 100 mg/5 mL 10 mL q.6 hours.  8. Lisinopril 10 mg daily.  9. Ferrous sulfate 325 b.i.d.  10. Procrit 10,000 units injectable with dialysis.  11. Omeprazole 20 mg b.i.d.  12. Flagyl 500 mg p.o. q.8 hours for 14 days.   DISCHARGE DIET: Low sodium.   DISCHARGE ACTIVITY: As tolerated.   DISCHARGE REFERRAL: Resume home health.   FOLLOW-UP: The patient will follow-up with his primary care physician in 2 to 4 weeks.   TIME SPENT: 35 minutes.  ____________________________ Janyth ContesSital P. Juliene PinaMody, MD spm:drc D: 05/27/2012 13:57:27 ET T: 05/27/2012 16:14:16 ET JOB#: [id number]  cc: Adonai Helzer P. Juliene PinaMody, MD, <Dictator> Janyth ContesSITAL P Mariadejesus Cade MD ELECTRONICALLY SIGNED 06/01/2012 12:26

## 2015-04-14 NOTE — Consult Note (Signed)
Pt on trial of dysphagia diet.  Hold on PEG at this time.  Notify me if PEG needed.   Electronic Signatures: Scot JunElliott, Robert T (MD)  (Signed on 26-Mar-13 17:37)  Authored  Last Updated: 26-Mar-13 17:37 by Scot JunElliott, Robert T (MD)

## 2015-04-14 NOTE — Discharge Summary (Signed)
PATIENT NAMEMalcom [name], Jared [name] MR#:  [id number] DATE OF BIRTH:  07/09/1951  DATE OF ADMISSION:  04/23/2012 DATE OF DISCHARGE:  04/28/2012   PRIMARY CARE PHYSICIAN: [facility]    REASON FOR ADMISSION: Came in with symptomatic anemia.   HISTORY OF PRESENT ILLNESS: This is a 64 year old [name] male with end-stage kidney disease on dialysis, history of right nephrectomy with possible renal cell carcinoma, recent MRSA sepsis, seizure disorder. He was sent in for a hemoglobin of 6.2. In the ER he passed stool which was heme negative. For his acute symptomatic anemia he was admitted to the hospital and given 1 unit of blood to start with. Dr. Lutricia Feil from Gastroenterology was consulted.   LABORATORY AND RADIOLOGICAL DATA DURING THE HOSPITAL COURSE: EKG showed sinus tachycardia, nonspecific T wave abnormality.   Glucose 84, BUN 8, creatinine 2.29, sodium 137, potassium 3.5, chloride 99, CO2 32, calcium 7.7, alkaline phosphatase 159, ALT 13, AST 39, albumin 1.9, white blood cell count 13.1, hemoglobin and hematocrit 6.2 and 19.5, platelet count 460, ferritin 1420, LDH 301. Hemoglobin dipped down to 5.6 on May 5th. B12 level 1569.   Echocardiogram showed EF of greater than 55%, moderate aortic regurgitation, right ventricular systolic pressure elevated at 30 to 40. RBC folate 499. Hematocrit 24.8 on the 5th. Blood culture negative. Rectal biopsy showed no dysplasia or malignancy. Hemoglobin on the 6th up to 8.7. PTH intact, 215. Hemoglobin upon discharge 8.8. White count 15.2.   FINAL DIAGNOSES:  1. Acute hemorrhagic anemia.  2. GI bleed, rectal ulcer, and gastritis.  3. Hypotension, now hypertension.  4. End-stage renal disease.  5. The patient did have a low-grade temperature which resolved and slightly elevated white count but no source of fever.  6. Seizure disorder.  7. Constipation.   MEDICATIONS ON DISCHARGE:  1. Norco 5/325 one tablet every six hours as needed for  pain. 2. Amlodipine 1 tablet daily.  3. Sensipar 30 mg daily.  4. Renvela 800 mg 3 tablets with meals. 5. Keppra 1000 mg at bedtime. 6. Duke's mouthwash 10 mL every six hours as needed for pain in the mouth. 7. Guaifenesin 100 mg/5 mL 10 mL every six hours as needed for cold symptoms.  8. Lisinopril 10 mg p.o. daily.  9. Ferrous sulfate 325 mg p.o. twice a day. 10. Procrit 10,000 units IV with dialysis.  11. Omeprazole 20 mg twice a day.  DO NOT TAKE: Do not take any blood thinners.   ACTIVITY: As tolerated.   REFERRAL: Physical therapy.   DIALYSIS: Continue dialysis as per routine.  FOLLOW-UP: 1. Follow-up with Dr. Bluford Kaufmann, Gastroenterology, in 2 to 3 weeks.  2. Follow-up in 1 to 2 days with doctor at [facility]. 3. Check CBC next week. Last PSA was in the normal range. Recheck in six months.   HOSPITAL COURSE PER PROBLEM LIST:  1. For the patient's acute hemorrhagic anemia, the patient was transfused a total of 2 units of packed red blood cells, given Procrit with dialysis, and ferrous sulfate given upon discharge. Colonoscopy showed a rectal ulcer and endoscopy showed a gastritis. The patient was put on omeprazole.  2. Initially the patient was hypotensive, now the patient is hypertensive. Amlodipine and lisinopril for blood pressure.  3. For his end-stage renal disease, he was on hemodialysis. Last hemodialysis on the day of discharge.  4. For constipation, he is on Colace.  5. For his seizure disorder, he is on Keppra.  6. The patient did have a low-grade  temperature which had resolved and white count is slightly elevated. No source of fever. Blood culture negative.   DISPOSITION: The patient is discharged back to the facility in stable condition. Close clinical follow-up on blood counts needed.   TIME SPENT ON DISCHARGE: 40 minutes.   ____________________________ Herschell Dimesichard J. Renae GlossWieting, MD rjw:drc D: 04/28/2012 08:06:32 ET T: 04/28/2012 08:29:38  ET JOB#: [id number]  cc: Herschell Dimesichard J. Renae GlossWieting, MD, <Dictator> [facility] Salley ScarletICHARD J Syanne Looney MD ELECTRONICALLY SIGNED 05/02/2012 12:52

## 2015-04-14 NOTE — Consult Note (Signed)
Asked to be paged today when interpreter was here however did not receive a call.  Electronic Signatures: Riki AltesStoioff, Pace Lamadrid C (MD)  (Signed on 27-Mar-13 18:09)  Authored  Last Updated: 27-Mar-13 18:09 by Riki AltesStoioff, Leahna Hewson C (MD)

## 2015-04-14 NOTE — Discharge Summary (Signed)
PATIENT NAMLenora [name]:  [name], Jared MR#:  [id number] DATE OF BIRTH:  September 25, 1951  DATE OF ADMISSION:  02/23/2012 DATE OF DISCHARGE:  03/22/2012  ADMITTING PHYSICIAN:  Dr. Hilda LiasVivek Sainani DISCHARGING PHYSICIAN:  Dr. Enid Baasadhika Deyci Gesell     PRIMARY CARE PHYSICIAN: None.   CONSULTATIONS IN THE HOSPITAL:  1. Nephrology consultation by Dr. Mady HaagensenMunsoor Lateef, Dr. Thedore MinsSingh, Dr. Wynelle LinkKolluru.  2. ENT consultation by Dr. Bud Facereighton Vaught.  3. Vascular surgery consultation for access issues by Dr. Festus BarrenJason Dew.  4. Pulmonary critical care consultation by Dr. Belia HemanKasa.  5. Cardiology consultation by Dr. Lady GaryFath.  6. Neurology consultation by Dr. Suzan SlickPeter Clarke.  7. Infectious disease consultation by Dr. Orson AloeMichael Blocker.  8. Palliative care consultation by Dr. Harriett SineNancy Phifer and team.  9. Urology consultation by Dr. Lonna CobbStoioff.  10. Hematology-oncology consultation by Dr. Janese BanksSandeep Pandit.  11. GI consultation by Dr. Lynnae Prudeobert Elliott.   DISCHARGE DIAGNOSES:  1. MRSA sepsis/bacteremia, unknown source, currently on daptomycin, will need for four weeks, status post PICC line.  2. Seizures on admission secondary to uremia and also MRSA sepsis on presentation, currently resolved. 3. Acute respiratory failure requiring mechanical ventilation mostly for airway protection. He has been extubated successfully since 03/03/2012. 4. Hypertension.  5. Endstage renal disease on Tuesday, Thursday, Saturday hemodialysis.  6. Secondary hyperparathyroidism.  7. Tongue laceration with swelling secondary to seizures requiring sutures.  8. Abnormal lucencies on spine with slightly elevated PSA.  Needs outpatient prostate biopsy and also PET scan.   DISCHARGE MEDICATIONS:  1. Norco 325/5 mg, 1 tablet p.o. every six hours p.r.n. for pain.  2. Norvasc 10 mg p.o. daily.  3. Sensipar 30 mg p.o. daily.  4. Duke's mouthwash 5 to 10 mL p.o. every six hours p.r.n. for oral pain.  5. Remeron 7.5 mg p.o. at bedtime.  6. Keppra 1000 mg p.o. at bedtime.   7. Daptomycin 350 mg IV q. 48 hours until 04/11/2012.  8. Prednisone taper to be started at 60 mg and taper off by 10 mg every day.   DISCHARGE HOME OXYGEN: None.   DISCHARGE DIET: Puree diet with nectar thick liquids.   DISCHARGE ACTIVITY: As tolerated.  FOLLOWUP INSTRUCTIONS:  1. Nephrology followup for dialysis on 03/24/2012.  2. ENT followup in two weeks.  3. Urology followup for prostate biopsy in 2 to 3 weeks.  4. Oncology followup with Dr. Sherrlyn HockPandit in four weeks.  5. Primary care physician followup in 1 to 2 weeks.  6. Physical therapy.  7. PICC line care and removal of PICC line after finishing daptomycin.   LABS AND IMAGING STUDIES: Most recent 03/19/2012, sodium 137, potassium 5.1, chloride 97, bicarbonate 24, BUN 100, creatinine 7.3, glucose 124, calcium 6.5. WBC 12.8, hemoglobin 10.3, hematocrit 30.2, platelet count 194.  Whole body scan done on 03/15/2012 showing diffuse nonspecific increase in tracer activity throughout the skeletal system. Differential could be secondary hyperparathyroidism, renal osteodystrophy, diffuse metastatic disease, osteomalacia, or Paget's disease.  His last PTH was elevated at 748. His last blood cultures from 03/14/2012 are negative for MRSA.  His last positive blood cultures are from 03/10/2012.  Last chest x-ray showing mild cardiomegaly, no acute cardiopulmonary disease done on 03/11/2012.  BRIEF HOSPITAL COURSE: For more details, please also look at the History and Physical dictated by Dr. Cherlynn KaiserSainani on 02/23/2012, interim summary dictated by Dr. Camillo FlamingKamran Lateef on 03/04/2012, and interim summary dictated by Dr. Delfino LovettVipul Shah on 03/15/2012.  Mr. Jared [name] is a 64 year old [place]ian male with past medical history significant for questionable renal cell  carcinoma status post right nephrectomy on hemodialysis and history of hypertension who was brought in secondary to abdominal pain, nausea, vomiting, and uremic symptoms. After admission the patient went for  hemodialysis and had status epilepticus that required intubation after extensive tongue bite with laceration requiring sutures. The patient was intubated and was in the Intensive Care Unit from 02/24/2012 up until 03/03/2012, when he was successfully extubated. Post extubation the patient had extensive trouble with swallowing issues secondary to tongue swelling and also laceration and sutures. Tongue sutures were required.  He has been on steroids.  Swelling improved and currently he is on a dysphagia diet.  1. Acute respiratory failure: Requiring mechanical ventilation for airway protection, extubated 03/03/2012. Doing well, currently saturating well on room air. His steroids are being tapered at the time of discharge.  2. Sepsis/bacteremia: His admission cultures were positive for MRSA. No source has been identified. So far he has had PTE, TEE, and whole body scan which were negative for any source. However, his last cultures from 03/14/2012 are negative so far. He is currently on daptomycin and will need four weeks of IV daptomycin from 03/14/2012, which will be until 04/11/2012.   A PICC line will be placed and he will get daptomycin at the skilled nursing facility, 350 mg IV every 48 hours.  3. Seizures: No prior history of seizures. Had status epilepticus while having dialysis during day two of admission, likely probably from uremia versus MRSA sepsis. Neurology has followed the patient. He was placed on Keppra and once his metabolic problems resolved he has not had further seizure episodes while in the hospital. He will continue Keppra for now.  4. Endstage renal disease on Tuesday, Thursday, Saturday hemodialysis: He has an AV fistula and  will continue dialysis as per schedule.  His last dialysis is today at the time of discharge, 03/22/2012.  5. Tongue laceration with extensive edema, swelling requiring sutures: This happened during his status epileptic seizures.  ENT placed sutures.  Currently the  sutures have been taken out and tongue swelling has improved. He still has some pain and uses Duke's mouthwash p.r.n. and also Norco p.r.n. for pain. Currently on dysphagia I diet with puree and nectar thick liquids.  He will need outpatient ENT followup.  6. Elevated PSA with abnormal lucencies on spine. His PSA is slightly elevated at 6.  He was seen by urology but because of language communication they were not able to schedule a biopsy at this time.  However, urology also felt that if he needs prostate biopsy some time he might need anesthesia and his tongue is still not quite normal for him to have intubation again. This can be done as an outpatient. He will follow up with urology in the next 2 to 3 weeks. His whole body scan showed some lucencies in the spine. Thought process is most likely secondary to his chronic kidney disease. However, with elevated PSA prostate cancer cannot be ruled out with metastatic disease. However, Dr. Darden Dates opinion is this is less likely to be malignancy with such a minimal elevation of PSA. However, he will follow up with the patient in 3 to 4 weeks after prostate biopsy is done to see if he needs a PET scan or not.  7. The patient worked with physical therapy and is globally weak and will need extensive therapy as an outpatient, so he will be discharged to SNF, likely to [facility].   CODE STATUS: FULL CODE.  DISCHARGE  CONDITION: Stable.   DISCHARGE DISPOSITION: Skilled nursing facility.   TIME SPENT ON DISCHARGE: 45 minutes.   ____________________________ Enid Baas, MD rk:bjt D: 03/22/2012 11:47:51 ET T: 03/22/2012 12:33:13 ET JOB#: [id number]  cc: Enid Baas, MD, <Dictator> Scott C. Lonna Cobb, MD Kyung Rudd, MD Munsoor Lizabeth Leyden, MD Sandeep R. Sherrlyn Hock, MD Enid Baas MD ELECTRONICALLY SIGNED 03/25/2012 13:48

## 2015-04-14 NOTE — Consult Note (Signed)
Pt eating everything on his plate.  Advance slowly.  Electronic Signatures: Scot JunElliott, Louiza Moor T (MD)  (Signed on 29-Mar-13 11:51)  Authored  Last Updated: 29-Mar-13 11:51 by Scot JunElliott, Maddelynn Moosman T (MD)

## 2015-04-14 NOTE — Consult Note (Signed)
ONCOLOGY followup note - patient awake, responds to simple questions with gestures/short answers and follows simple commands, otherwise noncommunicative but also does not speak english.  No family at bedside.  Denies pain.no fevers or obvious bleeding symptomsalert, no acute distress, on nasal cannula oxygen          Vitals - 97.8, 67, 22, 126/67, 97% on 4 L          lungs - bilateral diminished breath sounds, no rhonchi          Abdomen - soft, nontenderhemoglobin 9.7 today.  SIEP pending  64 year old gentleman with history of endstage renal disease on dialysis, status post right nephrectomy (no reported history of malignancy per chart record) who has been admitted with complicated medical issues including seizures after which he required mechanical ventilation for respiratory failure, MRSA bacteremia. Since extubation he continues to be lethargic, also NPO at this time. General condition remains poor, although he is awake and more alert today. Recent CT scan showed some abnormalities concerning for malignancy including indeterminate 8-mm nodule in the right lower lobe of the lung, small subcentimeter possible lesion in the left kidney, subtle lucencies in the lumbar spine and pelvis suspicious for metastatic disease. Recent PSA was abnormal at 6.3. There was some bladder wall thickening also. Serum protein immunofixation electrophoresis to look for M spike is pending. If his condition improves then further work-up can be obtained including pursuing PET scan or bone scan to evaluate for skeletal metastasis. Would also recommend urology evaluation once his condition improves. If the patient does have occult metastatic malignancy, this would be incurable stage disease and overall prognosis seems poor. No family at bedside to discuss with at this time. Palliative care has been consulted and they are discussing with the patient's family regarding code status and goals of treatment.  No obvious pain issues at this  time. Will continue to follow.     Electronic Signatures: Izola PricePandit, Amiere Cawley Raj (MD)  (Signed on 24-Mar-13 12:43)  Authored  Last Updated: 24-Mar-13 12:43 by Izola PricePandit, Cable Fearn Raj (MD)

## 2015-04-14 NOTE — Consult Note (Signed)
ONCOLOGY followup - awake, resting in bed, responds to simple questions and denies pain. Otherwise noncommunicative and does not speak english.  no fevers. No bleeding symptomsalert, no acute distress, on nasal cannula oxygen          Vitals - afebrile, stable          lungs - bilateral diminished breath sounds, no rhonchi          Abdomen - soft, nontenderhemoglobin 9.9 today.  SIEP negative for M-spike. PSA 306.483.  64 year old gentleman with history of endstage renal disease on dialysis, HTN, s/p right nephrectomy (no reported history of malignancy per chart record) who has been admitted with complicated medical issues including seizures after which he required mechanical ventilation for respiratory failure, MRSA bacteremia. Patient still weak but awake and alert. PSA elevated, enlarged prostate and thickening in bladder wall on CT scan, awaiting eval by urology. Otherwise SIEP negative for M-spike which makes myeloma unlikely. Scans were discussed in tumor board today, radiology feels that nodule in lung and kidney are small and difficult to characterize, and bone scan does not strong evidence of metastatic lesions, and that these bone abnormalities can be seen in dialysis patients. Will f/u after urology evaluation is done.  No family/interpretor at bedside to discuss with at this time. Palliative care has been consulted and they are discussing with the patient's family regarding code status and goals of treatment.     Electronic Signatures: Izola PricePandit, Katieann Hungate Raj (MD)  (Signed on 28-Mar-13 21:39)  Authored  Last Updated: 28-Mar-13 21:39 by Izola PricePandit, Jahmiyah Dullea Raj (MD)

## 2015-04-14 NOTE — Op Note (Signed)
PATIENT NAMLenora Boys:  Jared [name], Jared [name] MR#:  [id number] DATE OF BIRTH:  1951-05-21  DATE OF PROCEDURE:  02/24/2012  PREOPERATIVE DIAGNOSES:  1. Respiratory failure.  2. Hypotension requiring pressors.  3. Seizures.  4. End-stage renal disease.   POSTOPERATIVE DIAGNOSES: 1. Respiratory failure.  2. Hypotension requiring pressors.  3. Seizures.  4. End-stage renal disease.   PROCEDURES:  1. Ultrasound guidance for attempted access of both jugular veins, unable to pass wire.  2. Placement of right femoral triple lumen catheter with ultrasound guidance.   SURGEON: Annice NeedyJason S. Robert Sperl, MD    ANESTHESIA: Local.   ESTIMATED BLOOD LOSS: Minimal.   INDICATION FOR PROCEDURE: The patient is a 64 year old [place] male with multiple ongoing issues. He apparently had seizures and lost his airway after dialysis today. He has now been intubated and taken to the unit. At current he is hypotensive and they would like to initiate pressors but have poor venous access. I had previously seen this patient for dialysis access and he has a functional fistula. Central line is necessary for immediate access and this was done in an emergent fashion.   DESCRIPTION OF PROCEDURE: The patient was laid flat in his critical care bed. Initially his left neck was prepped and draped. The left jugular vein was small but patent in the neck and it could be accessed with a needle but a wire would not pass beyond the clavicle. He had had PermCaths on each side and I suspected a venous occlusion. I then turned my attention to the right jugular vein. On this side the external jugular vein was very large and prominent; the internal jugular vein was small. Again, the internal jugular vein could be accessed under direct ultrasound guidance and was but a wire would not pass. At this point, particularly with the urgency of the situation, I abandoned this option and went to a femoral approach. The right groin was sterilely prepped and draped. The right  femoral vein was patent and accessed under direct ultrasound guidance without difficulty with a Seldinger needle. At this time a J-wire passed easily. After skin nick and dilatation, the triple lumen catheter was placed over the wire and the wire was removed. All three ports withdrew dark red nonpulsatile blood and flushed easily with sterile saline. It was secured with three silk sutures.   ____________________________ Annice NeedyJason S. Mysha Peeler, MD jsd:drc D: 02/25/2012 11:16:50 ET T: 02/25/2012 11:29:59 ET JOB#: [id number]  cc: Annice NeedyJason S. Sameerah Nachtigal, MD, <Dictator> Annice NeedyJASON S Shamone Winzer MD ELECTRONICALLY SIGNED 03/04/2012 16:27

## 2015-04-14 NOTE — Consult Note (Signed)
Pt no longer c/o abd pain. EGD showed prob gastritis affecting fundus of stomach. Bx's taken. Continue daily prilosec. Await rectal ulcer bx. If hgb stable, no further w/u from GI. Will sign off. Pt can f/u in our office later as outpt. Thanks  Electronic Signatures: Lutricia Feilh, Saba Neuman (MD)  (Signed on 08-May-13 17:21)  Authored  Last Updated: 08-May-13 17:21 by Lutricia Feilh, Washington Whedbee (MD)

## 2015-04-14 NOTE — Consult Note (Signed)
Pt had mixed results with evaluation today on swallowing tests.  No plans for feeding tube at this time.  He is being evaluated for possible metastatic disease.    Electronic Signatures: Scot JunElliott, Robert T (MD)  (Signed on 25-Mar-13 18:25)  Authored  Last Updated: 25-Mar-13 18:25 by Scot JunElliott, Robert T (MD)

## 2015-04-14 NOTE — Consult Note (Signed)
Was asked to evaluate by oncology for a PSA of 6 and the possibility of metastatic disease.  The patient does not be English and have been unable to see the patient when a interpreter is present.  A biopsy would need to be discussed with an interpreter.  The likelihood of metastatic disease with this PSA level would be less likely.  His primary physician indicates he is close to discharge to a skilled nursing facility.  He can be seen for an outpatient evaluation.  Electronic Signatures: Riki AltesStoioff, Scott C (MD)  (Signed on 01-Apr-13 17:51)  Authored  Last Updated: 01-Apr-13 17:51 by Riki AltesStoioff, Scott C (MD)

## 2015-04-14 NOTE — Consult Note (Signed)
Chief Complaint:   Subjective/Chief Complaint No acute events overnight.  Some oozing from tongue.  Keeping area moist.   Brief Assessment:   Additional Physical Exam HEENT-  some continue edema of tongue, though not increased from yesterday.  blanching of tip of tongue sluggish but present, slight drainage from right lateral tongue.   Assessment/Plan:  Assessment/Plan:   Assessment s/p repair of tongue laceration    Plan Continue sedation and intubation until edema improves.  Discussed with daughter possibility of trach next week if continued edema of tongue for airway control   Electronic Signatures: Leonie Amacher, Rayfield Citizenreighton Charles (MD)  (Signed 07-Mar-13 13:52)  Authored: Chief Complaint, Brief Assessment, Assessment/Plan   Last Updated: 07-Mar-13 13:52 by Flossie DibbleVaught, Ayleah Hofmeister Charles (MD)

## 2015-04-14 NOTE — Consult Note (Signed)
I met with daughter, Palliative care, hospital personel, discussed possible PEG, indications, complications, usage.  Recommend speech pathologist evaluate the patient with his daughter present to see if he can swallow.  This evaluation is needed before placement of a PEG is done.  His nurse says the patient is much more cooperative makes more effort when his daughter is present.  He has pulled out an NG tube 3 times so he should get a Twire system for his PEG if it is done.    Electronic Signatures: Manya Silvas (MD)  (Signed on 22-Mar-13 15:43)  Authored  Last Updated: 22-Mar-13 15:43 by Manya Silvas (MD)

## 2015-04-14 NOTE — Consult Note (Signed)
Pt swallowing without aspirating per nurse.  Let pt try diet for a few days, supplements as needed.  Hold on plans for G tube.  Electronic Signatures: Scot JunElliott, Roslyn Else T (MD)  (Signed on 27-Mar-13 14:40)  Authored  Last Updated: 27-Mar-13 14:40 by Scot JunElliott, Amali Uhls T (MD)

## 2015-04-14 NOTE — Consult Note (Signed)
PATIENT NAMLenora Boys:  Jared [name], Jared [name] MR#:  [id number] DATE OF BIRTH:  1951-06-13  DATE OF CONSULTATION:  02/24/2012  REFERRING PHYSICIAN:  Erin FullingKurian Kasa, MD CONSULTING PHYSICIAN:  Annice NeedyJason S. Shaylin Blatt, MD  REASON FOR CONSULTATION: Central line placement.   HISTORY OF PRESENT ILLNESS: The patient is a 64 year old Laotian individual who I have previously seen for his dialysis access. He is admitted to the hospital. He apparently had a seizure with dialysis today and is now intubated and sedated and in a critical condition in the critical care unit. He requires multiple intravenous medications. Particularly given his dialysis access, he has limited venous access options, and we are consulted for central line placement. He is unable to provide any history and this is obtained from the previous medical record.   PAST MEDICAL HISTORY:  1. End-stage renal disease.  2. Hypertension.  PAST SURGICAL HISTORY: Dialysis access and percutaneous interventions to these.   ALLERGIES: No known drug allergies.   HOME MEDICATIONS:  1. Amlodipine 10 mg daily.  2. Lisinopril 20 mg daily.  3. Norco as needed for pain.  4. Renvela 800 mg 3 tabs three times daily with meals.  5. Sensipar 30 mg daily.   SOCIAL HISTORY: Per the history and physical, there is no smoking or tobacco.   FAMILY HISTORY: Unknown/noncontributory.  REVIEW OF SYSTEMS: Unobtainable.     PHYSICAL EXAMINATION:   GENERAL: This is a small [place]ian male who is in the critical care unit, intubated and sedated. He has significant bleeding from his mouth and tongue and ENT is actually seeing the patient for this. Otherwise endotracheal and orogastric tubes are in place.   VITAL SIGNS: Temperature 98.8, pulse 102, blood pressure 151/56, and saturations are 99% on the ventilator.   HEENT: Tongue and facial swelling as described above with bleeding from the mouth.   NECK: Supple. No adenopathy or jugular venous distention.   HEART: Tachycardic but regular.    LUNGS: Coarse breath sounds on the ventilator bilaterally.   ABDOMEN: Soft and nondistended. Unable to assess tenderness.   EXTREMITIES: Warm and well perfused.   NEUROLOGIC: Unable to assess on the ventilator.   SKIN: Warm and dry.   LABS/STUDIES: Sodium is 135, potassium 5.2, chloride 91, CO2 14, BUN 132, creatinine 21.85, and glucose 136. White blood cell count is 28.6, hemoglobin 11.6, and platelet count 163,000.            ASSESSMENT AND PLAN: This is a 64 year old [place]ian male with end-stage renal disease and need for venous access. We will attempt to place a central line at the bedside.   This is a level-3 consultation.  ____________________________ Annice NeedyJason S. Franko Hilliker, MD jsd:slb D: 03/16/2012 13:54:45 ET T: 03/16/2012 14:17:15 ET JOB#: [id number]  cc: Annice NeedyJason S. Windie Marasco, MD, <Dictator> Annice NeedyJASON S Silvia Hightower MD ELECTRONICALLY SIGNED 03/17/2012 16:22

## 2015-04-14 NOTE — Consult Note (Signed)
Pt seen and examined. Unable to get much hx due to language barrier. Clearly needs to have at least a colonoscopy. Will prep patient later today for colonoscopy tomorrow afternoon if patient and family agrees.Will follow. Thanks  Electronic Signatures: Lutricia Feilh, Prachi Oftedahl (MD)  (Signed on 05-May-13 10:49)  Authored  Last Updated: 05-May-13 10:49 by Lutricia Feilh, Annalissa Murphey (MD)

## 2015-04-14 NOTE — Consult Note (Signed)
Chief Complaint:   Subjective/Chief Complaint improved tongue swelling, no bleeding   Brief Assessment:   Additional Physical Exam HEENT- tongue with continued improvement, now able to keep within mouth fully   Assessment/Plan:  Assessment/Plan:   Assessment s/p repair of tongue laceration    Plan Significant improvement with almost complete resolution of edema.  Will remove sutures either tomorrow or thursday   Electronic Signatures: Vincent Ehrler, Rayfield Citizenreighton Charles (MD)  (Signed 12-Mar-13 17:13)  Authored: Chief Complaint, Brief Assessment, Assessment/Plan   Last Updated: 12-Mar-13 17:13 by Flossie DibbleVaught, Otoniel Myhand Charles (MD)

## 2015-04-14 NOTE — Consult Note (Signed)
PATIENT NAMLenora [name]:  Jared [name], Jared [name] MR#:  [id number] DATE OF BIRTH:  [birth year]  DATE OF CONSULTATION:  02/24/2012  REFERRING PHYSICIAN:   CONSULTING PHYSICIAN:  Kyung Ruddreighton C. Analya Louissaint, MD  CORRECTION ON A CONSULTATION THAT WAS PUT IN ON 02/24/2012.   HISTORY OF PRESENT ILLNESS: On the second line it says: During that he bit the Emergency Department of his tongue and it should be anterior tip of his tongue instead of Emergency Department.   ____________________________ Kyung Ruddreighton C. Greyson Peavy, MD ccv:cms D: 02/26/2012 15:52:13 ET T: 02/26/2012 16:16:51 ET JOB#: [id number]  cc: Kyung Ruddreighton C. Aveleen Nevers, MD, <Dictator> Kyung RuddREIGHTON C Tomara Youngberg MD ELECTRONICALLY SIGNED 03/03/2012 16:44

## 2015-04-14 NOTE — H&P (Signed)
[name], Jefferey MR#:  [id number] DATE OF BIRTH:  [birth year]  DATE OF ADMISSION:  04/23/2012  CHIEF COMPLAINT: Profound symptomatic anemia.   HISTORY OF PRESENT ILLNESS: Jared [name] is a 64 year old non-English speaking [place] immigrant with a history of end-stage kidney disease on dialysis, possible renal cell carcinoma with a history of right nephrectomy, recent prolonged admission to [facility] from March 5th to April 2nd for MRSA sepsis and seizures secondary to uremia with resultant vent-dependent respiratory failure secondary to seizures and need for airway protection who was noted to be profoundly anemic today during hemodialysis and was sent over to the ER for a hemoglobin of 6.2. ER doctor tried for three hours to get the [name] interpreter on the phone and was unable to do so. History was provided by the patient's daughter, Ratsamy, who is an adult and lives with Mr. Edgerly. Per daughter, he has been living at rehab since he was discharged from the hospital and has been having episodes of bright red blood per rectum on and off for several weeks. He does not report any bloody stools today but he has had abdominal cramping all day today. In the ER he spontaneously passed stool which was heme negative. He denies any other symptoms currently. He has no history of colonoscopy. Please note that during last admission there was question of need for prostate biopsy due to an elevated PSA and some abnormal appearing lesions on his spine that were concerning for metastatic disease.   PAST MEDICAL HISTORY: 1. End-stage kidney disease on hemodialysis Tuesdays, Thursdays, and Saturdays. 2. Anemia of chronic kidney disease with severe drop during last admission requiring transfusion.  3. Concern for metastatic cancer with all work-ups postponed due to prior admission for sepsis and tongue laceration requiring stitches.  4. Possible history of renal cell cancer with history of right nephrectomy.   5. History of MRSA bacteremia March 2013 requiring PICC line for prolonged antibiotic therapy with daptomycin.  6. History of placement of left brachial PICC line 03/22/2012.  7. History of life-threatening hemorrhage from left arm dialysis graft January 2012.   CURRENT MEDICATIONS:  1. Sensipar 30 mg once a day.  2. Renvela 800 mg 3 tablets daily with meals.  3. Lisinopril 20 mg daily.  4. Amlodipine 10 mg daily.  5. Norco 325/5 one tablet every eight hours as needed for pain.   PAST SURGICAL HISTORY:  1. History of right nephrectomy.  2. Excision of left upper extremity AV graft.  3. Left brachial artery pseudoaneurysm repair.  ALLERGIES: He has no known drug allergies.   LAST HOSPITALIZATIONS:  1. March 5th to April 2nd for MRSA sepsis with seizures secondary to metabolic encephalopathy from uremia requiring intubation for airway protection. Discharged to skilled nursing facility for four additional weeks of daptomycin.  2. January 2012 for life-threatening hemorrhage from AV graft.   FAMILY HISTORY: Family history is not obtainable currently due to loss of interpreter.   SOCIAL HISTORY: The patient is still living at a rehab facility per daughter. History of tobacco use is not known. He is a nondrinker.   REVIEW OF SYSTEMS: Positive for fatigue. Negative for fever and weight changes. He denies any cough, wheezing, hemoptysis, or dyspnea. He denies chest pain and edema. He does have some dyspnea with exertion. He has not had any recent nausea, vomiting, or diarrhea but does endorse abdominal pain and bright red blood per rectum. He does continue to make a small amount of urine. He has no  history of easy bruising or bleeding. He has had some skin changes with extremely dry skin secondary to his end-stage kidney disease. He denies any recent history of headaches. The rest of review of systems is negative per daughter.   PHYSICAL EXAMINATION:   GENERAL: This is a middle-aged male who  looks older than his stated age. He is withdrawn. His affect is flat.   ADMISSION VITAL SIGNS: Temperature 97, pulse 110 and regular, respirations 18, blood pressure 128/68, pulse oximetry 100% on room air.   HEENT: Pupils are equal, round, and reactive to light. Extraocular movements are intact. Sclerae are nonicteric. Oropharynx is benign. Mucous membranes are pale.   LUNGS: Clear to auscultation anteriorly with no wheezing or rhonchi.   CARDIOVASCULAR: Tachycardic with a 3/6 systolic murmur over the aortic valve. Chest wall is nontender.   ABDOMEN: Soft, nontender. Bowel sounds are heard. There is no hepatosplenomegaly.   SKIN: Skin is extremely dry with flaking noted on arms and legs.   LYMPH: There is no cervical, axillary, inguinal, or supraclavicular lymphadenopathy.   NEUROLOGIC: Grossly nonfocal. It is difficult to tell if he is oriented since he does not speak [place].   LABORATORY, DIAGNOSTIC, AND RADIOLOGICAL DATA: Hemoglobin 6.2. Hemoglobin was 11.1 in April of 2012. White count 13.1, platelets 460, MCV 91, sodium 137, potassium 3.5, chloride 99, bicarb 32, BUN 8, creatinine 2.29, glucose 84, total protein 7.2, albumin 1.9, total bilirubin 0.7, alkaline phosphatase 159, AST 39, white count again 13.1, hemoglobin 6.2, hematocrit 19.5, platelets 460, MCV 91. Antibody screen has been done. He is B+ blood type.   ASSESSMENT AND PLAN:  1. Symptomatic anemia of acute blood loss. The patient's hemoglobin has dropped five points since his last hemoglobin during April admission. It is noted that during his last admission Epogen was stopped due to his admission with seizures. He has had a history of bright red blood per rectum per patient although he was heme negative today which raises differential for colon cancer, AV malformation, or diverticular bleed given his concurrent acute abdominal pain. Will transfuse the patient 2 units. He will need a CT scan of the abdomen and pelvis and a GI  consult. This will all be very problematic if we cannot get a [place] interpreter but his daughter is planning on being here tomorrow for most of the day to provide her services if there is no alternative available for the hospital.  2. End-stage kidney disease. Will ask Nephrology to see the patient while he is in-house as will need to continue dialysis schedule Tuesday, Thursday, Saturday.  3. Severe malnutrition likely secondary to chronic disease complicated by tongue laceration during last admission during uremic seizures causing compromise of nutritional status. Will have dietary see him.  4. Systolic murmur. Given his history of bacteremia, I am concerned that this murmur may be more than just a flow murmur and may actually be a vegetation. Will start with a 2-D echo to look for signs of vegetations. Will also check for signs of hemolysis prior to transfusing the patient.   ESTIMATED TIME OF CARE: 1 hour and 20 minutes due to lack of translator.   ____________________________ Duncan Dulleresa Jarmarcus Wambold, MD tt:drc D: 04/23/2012 23:43:56 ET T: 04/24/2012 09:12:27 ET JOB#: [id number]  cc: Duncan Dulleresa Taylen Osorto, MD, <Dictator> Duncan DullERESA Janella Rogala MD ELECTRONICALLY SIGNED 04/28/2012 18:48

## 2015-04-14 NOTE — Consult Note (Signed)
Pt has a small rectal ulcer. Bx taken. Not sure this is enough to cause hgb to drop this much. Recommend EGD as well. Since I am not available tomorrow, plan EGD on Wed.. Thanks  Electronic Signatures: Lutricia Feilh, Krisna Omar (MD)  (Signed on 06-May-13 18:34)  Authored  Last Updated: 06-May-13 18:34 by Lutricia Feilh, Persephone Schriever (MD)

## 2015-04-14 NOTE — Consult Note (Signed)
Brief Consult Note: Diagnosis: Severe tongue laceration.   Patient was seen by consultant.   Consult note dictated.   Recommend further assessment or treatment.   Discussed with Attending MD.   Comments: 64 y.o. male s/p seizure with severe laceration of tongue through biting tongue.  Respriatory compromise due to aspriation of blood.  Intubated and I was asked to evaluate due to bleeding and tongue laceration.  PE Laceration of tongue in anterior 1/3 of tongue extending from right lateral border just across midline involving a depth of about 50% of tongue.  Continue oozing.  Impression: 1)  Laceration of tongue- close at beside losely with multiple layers and horizontal mattress 2)  Anticipate signficant edema of tip of tongue.  Keep area moistened.  Patient may need tracheostomy tube if edema does not improve due to airway concerns. 3)  Patient may need bite block in place to reduce chances of reinjuring tongue.  Electronic Signatures: Marlo Goodrich, Rayfield Citizenreighton Charles (MD)  (Signed 06-Mar-13 18:15)  Authored: Brief Consult Note   Last Updated: 06-Mar-13 18:15 by Flossie DibbleVaught, Jonavon Trieu Charles (MD)

## 2015-04-14 NOTE — Consult Note (Signed)
PATIENT NAMEMarkeem [name], Elkin MR#:  [id number] DATE OF BIRTH:  [birth year]  DATE OF CONSULTATION:  04/25/2012  REFERRING PHYSICIAN:  Alford Highland, MD CONSULTING PHYSICIAN:  Chinedu Agustin R. Sherrlyn Hock, MD  REASON FOR CONSULTATION: Consult for ? metastatic prostate cancer.   HISTORY OF PRESENT ILLNESS: The patient is a 64 year old Jared [name]  immigrant with known history of endstage kidney disease on dialysis, possible history of renal cell carcinoma status post right nephrectomy, recent prolonged admission at [facility] from 03/05 to 03/22/2012 for sepsis, seizures secondary to anemia, and respiratory failure requiring mechanical ventilation. The patient also was found to be profoundly anemic on 04/24/2012 with hemoglobin of 6.2 at dialysis and was sent to the emergency room. The patient is non-English speaking, answers with gestures and single word answers only. History is therefore obtained from chart record. He had been seen by me during previous hospitalization in March for some abnormal bone lesions and had undergone work-up at that time. SIEP was negative for M spike, PSA was elevated with enlarged prostate and thickening in bladder wall on CT scan and the patient was referred for urology evaluation, it does not appear that he has seen them or had any biopsies done. Also scans have been discussed in Tumor Board and radiology felt that the nodule in the lung and kidney was small and difficult to characterize, and bone scan did not strongly support metastatic lesions and these kind of bone abnormalities could be seen in dialysis patients. The patient denies any progressive bone pains or other pain issues. He is awaiting colonoscopy since he has had some rectal bleeding, indicates that he is hungry. Denies weight loss. Generalized weakness and fatigability.   PAST MEDICAL HISTORY/PAST SURGICAL HISTORY: As in history of present illness above. In addition: 1. The patient is on hemodialysis with end-stage kidney disease,  on Tuesday, Thursday, and Saturday schedule.  2. History of left brachial PICC line placed 03/22/2012.  3. History of life-threatening hemorrhage from left arm dialysis graft, January 2012.   HOME MEDICATIONS:  1. Renvela 800 mg three times daily with meals. 2. Sensipar 30 mg daily. 3. Lisinopril 20 mg daily.  4. Amlodipine 10 mg daily.  5. Norco 5/325 mg 1 tablet every 8 hours p.r.n.   PAST SURGICAL HISTORY:  1. Right nephrectomy for possible renal cell carcinoma.  2. Excision of left upper extremity AV graft. 3. Left brachial artery pseudoaneurysm repair.   ALLERGIES: No known drug allergies.   FAMILY HISTORY: Unobtainable.   SOCIAL HISTORY: Residing at rehab facility. Otherwise unremarkable.   REVIEW OF SYSTEMS: Limited given that he does not speak [place]. The patient denies any fevers or chills at this time. He has generalized weakness. He denies any headaches. He denies any progressive cough, sputum, or hemoptysis. He has some shortness of breath on activity. He denies any diarrhea, has had some recent blood in stools. He denies any new bone pains. He states that he does feel hungry and eats regularly.   PHYSICAL EXAMINATION:   GENERAL: The patient is a moderately built thin individual resting in bed, weak and tired looking. Otherwise alert and tries to converse appropriately. No acute distress.   VITAL SIGNS: 98.6, 98, 18, 154/75, 98% on room air.   HEENT: Normocephalic, atraumatic. Extraocular movements intact. Sclera anicteric.   NECK: Supple without lymphadenopathy.   CARDIOVASCULAR: S1 and S2, regular rate and rhythm.   LUNGS: Bilateral good air entry, decreased at bases.   ABDOMEN: Soft, nontender, hepatosplenomegaly clinically.   EXTREMITIES: Mild edema.  No cyanosis.   LYMPHATICS: No adenopathy in the axillary or inguinal areas.   NEUROLOGIC: Grossly nonfocal, moves all extremities spontaneously.   LABORATORY DATA: Hemoglobin on admission 6.2, WBC 13.1,  platelets 460, and MCV 91. Hemoglobin today is 8.7 with platelets 433 and WBC 13,600. Creatinine is 4.66, calcium 7.8, and potassium 3.5.   Blood culture is negative so far.   RBC and folate are pending. B12 level is high. LFTs on 04/24/2012 are unremarkable, except albumin is very low at 1.6. LDH is slightly elevated at 301. Ferritin is high at 1420. Most recent PSA on 03/19/2012 was normal at 1.2 (on 02/23/2011 it was elevated at 6.3).   IMPRESSION AND RECOMMENDATIONS: This is a 64 year old gentleman with known history of hypertension, endstage renal disease on dialysis, status post right nephrectomy with questionable history of renal cell carcinoma (no records available regarding this and the patient does not speak [place]), also had recently been seen by oncology in March for bony abnormalities which eventually were felt more likely to be nonmalignant findings seen in this dialysis patient. Also repeat PSA on 03/19/2012 had normalised at 1.2 (formerly elevated on 02/23/2012 at 6.3). The patient also has small nonspecific lung nodule and kidney nodule. Recent SIEP negative for M-spike also. The patient did have enlarged prostate and some bladder wall thickening on CT scan in March, recommend getting urology follow-up since it does not appear that he has seen them or had any kind of biopsy. At this time, the patient has acute progressive anemia, possibly from GI blood loss, and is planned for colonoscopy today. Will await result of colonoscopy and follow-up. B12 is unremarkable, RBC and folate are pending. If anemia is from bleeding, recommend starting on oral iron therapy also. Otherwise, the patient has anemia of end-stage renal disease and likely gets Epogen with dialysis treatments. We will continue to follow.   Thank you for the referral. Please feel free to contact me if any additional questions. ____________________________ Maren ReamerSandeep R. Sherrlyn HockPandit, MD srp:slb D: 04/26/2012 09:42:00 ET    T: 04/26/2012  10:12:41 ET       JOB#: [id number] cc: Raydan Schlabach R. Sherrlyn HockPandit, MD, <Dictator> Wille CelesteSANDEEP R Leler Brion MD ELECTRONICALLY SIGNED 04/27/2012 11:23

## 2015-04-14 NOTE — Consult Note (Signed)
PATIENT NAMECamdon [name], Jared [name] MR#:  [id number] DATE OF BIRTH:  1951/02/14  DATE OF CONSULTATION:  02/25/2012  REFERRING PHYSICIAN:  Vivek J. Cherlynn Kaiser, MD  CONSULTING PHYSICIAN:  Rose Phi. Kemper Durie, MD  HISTORY: Mr. 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 He was admitted 02/23/2012 with lower abdominal pain and nausea. He is referred for evaluation of new onset of seizures. History comes from his daughter and from his hospital chart.   The patient was brought to the Emergency Room at 11:30 a.m. on 02/23/2012 by EMTs from the Dialysis Center where the patient had reported difficulty urinating, dysuria, chills, lower abdominal pain, and problems for a few days with nausea and bilious emesis. He had had dialysis last on 02/18/2012, and he had missed dialysis on Saturday March 2nd.   On arrival to the Emergency Room, blood pressure was 160/77 with a heart rate 76, respirations 22, temperature 96.3. Oxygen saturation was 97%. Initial laboratory studies included BUN 123 and creatinine 20 and white blood count 22,900. Potassium was elevated at 6. CT scan was read to be suggestive of cystitis with bladder wall thickening.   At approximately 10:00 a.m. on 02/24/2012, prior to dialysis, he had a witnessed tonic-clonic seizure lasting two minutes which included biting of the tongue. There was reported a short postictal period. He then completed dialysis. He subsequently had two further seizures and received Ativan,   . On Versed and fentanyl in Critical Care Unit, his nurse reports that with sedation backed off, he has had some reaching about with the arms. He has no prior history of seizures. Blood cultures from admission have shown growth of staph aureus. He is treated with vancomycin. White blood count on 02/25/2012 remained  elevated at 29,300. He has had EEG that shows slowing with no seizure activity. Brain CT scan on 02/24/2012 shows multiple areas of chronic-appearing infarction.   PHYSICAL EXAMINATION: The patient is a well developed and slender Asian gentleman who was examined lying semisupine in the Critical Care Unit on the ventilator with blood pressure 100/50  and heart rate 86. Respiration rate was 12 per minute. On fentanyl and Versed, he had no abnormal movements and no observed change of  respiratory rate. To noxious stimuli, there was faint bilateral facial grimace and small amplitude movement of right or left hand or foot. He was found lying with eyes closed. With eyelids raised, he had conjugate  gaze with pupils responsive from 2.5 to 2 mm. Corneal responses were elicitable. Doll's responses were present. Tone was decreased in four extremities. Reflexes were 1+ throughout.   IMPRESSION:  1. The patient at present is sedated but has elicitable brainstem responses and no gross focal abnormalities.  2. New onset of witnessed seizures in the setting of metabolic abnormalities on admission status post missing dialysis treatments and finding notable for significant elevation of white blood count and blood cultures showing growth of staph aureus. At this point I cannot rule out central nervous system infection.   RECOMMENDATIONS:  1. He will be started on Keppra 1000 mg twice a day.  2. Continue excellent medical treatment.  3. Hold for now on giving the patient a lumbar puncture while treating infection as based on the result of blood cultures.   I appreciate being asked to see this interesting gentleman. I will plan to follow up on his  course in the hospital.   ____________________________ Rose PhiPeter R. Kemper Durielarke, MD prc:cbb D: 02/26/2012 12:40:27 ET T: 02/26/2012 13:42:57 ET JOB#: [id number]  cc: Rose PhiPeter R. Kemper Durielarke, MD, <Dictator> Gaspar GarbePETER R Rashed Edler MD ELECTRONICALLY SIGNED 04/13/2012 11:23
